# Patient Record
Sex: Male | Born: 1960 | Race: White | Hispanic: No | Marital: Married | Smoking: Never smoker
Health system: Southern US, Community
[De-identification: ages and names within clinical notes are randomized; demographics above are authoritative.]

## PROBLEM LIST (undated history)

## (undated) DIAGNOSIS — F419 Anxiety disorder, unspecified: Secondary | ICD-10-CM

## (undated) DIAGNOSIS — H8109 Meniere's disease, unspecified ear: Secondary | ICD-10-CM

## (undated) DIAGNOSIS — Z9889 Other specified postprocedural states: Secondary | ICD-10-CM

## (undated) DIAGNOSIS — R112 Nausea with vomiting, unspecified: Secondary | ICD-10-CM

## (undated) HISTORY — DX: Anxiety disorder, unspecified: F41.9

## (undated) HISTORY — DX: Meniere's disease, unspecified ear: H81.09

## (undated) HISTORY — DX: Other specified postprocedural states: Z98.890

## (undated) HISTORY — DX: Nausea with vomiting, unspecified: R11.2

## (undated) HISTORY — PX: ENDOLYMPHATIC SAC OPERATION: SUR433

---

## 2002-04-25 HISTORY — PX: SHOULDER SURGERY: SHX246

## 2004-03-29 ENCOUNTER — Emergency Department (HOSPITAL_COMMUNITY): Admission: EM | Admit: 2004-03-29 | Discharge: 2004-03-29 | Payer: Self-pay | Admitting: Emergency Medicine

## 2004-10-02 ENCOUNTER — Ambulatory Visit: Payer: Self-pay | Admitting: Family Medicine

## 2005-01-08 ENCOUNTER — Ambulatory Visit: Payer: Self-pay | Admitting: Family Medicine

## 2005-01-13 ENCOUNTER — Ambulatory Visit: Payer: Self-pay | Admitting: Family Medicine

## 2005-01-22 ENCOUNTER — Ambulatory Visit: Payer: Self-pay | Admitting: Family Medicine

## 2005-08-03 ENCOUNTER — Ambulatory Visit: Payer: Self-pay | Admitting: Family Medicine

## 2005-08-30 ENCOUNTER — Ambulatory Visit: Payer: Self-pay | Admitting: Family Medicine

## 2006-01-31 ENCOUNTER — Ambulatory Visit: Payer: Self-pay | Admitting: Family Medicine

## 2006-02-23 ENCOUNTER — Ambulatory Visit: Payer: Self-pay | Admitting: Family Medicine

## 2006-02-23 DIAGNOSIS — F339 Major depressive disorder, recurrent, unspecified: Secondary | ICD-10-CM

## 2006-05-26 DIAGNOSIS — M5126 Other intervertebral disc displacement, lumbar region: Secondary | ICD-10-CM

## 2006-10-18 ENCOUNTER — Ambulatory Visit: Payer: Self-pay | Admitting: Family Medicine

## 2006-10-18 ENCOUNTER — Encounter (INDEPENDENT_AMBULATORY_CARE_PROVIDER_SITE_OTHER): Payer: Self-pay | Admitting: Internal Medicine

## 2006-10-18 LAB — CONVERTED CEMR LAB
AST: 33 units/L (ref 0–37)
BUN: 11 mg/dL (ref 6–23)
CO2: 28 meq/L (ref 19–32)
Creatinine, Ser: 0.9 mg/dL (ref 0.4–1.5)
GFR calc Af Amer: 118 mL/min
Glucose, Bld: 108 mg/dL — ABNORMAL HIGH (ref 70–99)
HDL: 37 mg/dL — ABNORMAL LOW (ref >39.0)
Potassium: 4.1 meq/L (ref 3.5–5.1)
Sodium: 141 meq/L (ref 135–145)
Total CHOL/HDL Ratio: 3.5
Triglycerides: 80 mg/dL (ref 0–149)

## 2006-10-28 DIAGNOSIS — R0789 Other chest pain: Secondary | ICD-10-CM

## 2006-11-01 ENCOUNTER — Encounter: Payer: Self-pay | Admitting: *Deleted

## 2006-11-01 ENCOUNTER — Ambulatory Visit: Payer: Self-pay | Admitting: Internal Medicine

## 2006-11-01 DIAGNOSIS — F411 Generalized anxiety disorder: Secondary | ICD-10-CM | POA: Insufficient documentation

## 2007-08-17 ENCOUNTER — Ambulatory Visit: Payer: Self-pay | Admitting: Family Medicine

## 2007-08-17 DIAGNOSIS — E782 Mixed hyperlipidemia: Secondary | ICD-10-CM

## 2007-08-17 DIAGNOSIS — L821 Other seborrheic keratosis: Secondary | ICD-10-CM

## 2007-08-17 DIAGNOSIS — Q828 Other specified congenital malformations of skin: Secondary | ICD-10-CM | POA: Insufficient documentation

## 2007-08-18 LAB — CONVERTED CEMR LAB
ALT: 61 units/L — ABNORMAL HIGH (ref 0–53)
AST: 29 units/L (ref 0–37)
BUN: 12 mg/dL (ref 6–23)
GFR calc Af Amer: 104 mL/min
GFR calc non Af Amer: 86 mL/min
HDL: 33.1 mg/dL — ABNORMAL LOW (ref >39.0)
LDL Cholesterol: 83 mg/dL (ref 0–99)
Potassium: 4 meq/L (ref 3.5–5.1)
Sodium: 137 meq/L (ref 135–145)
Total CHOL/HDL Ratio: 4
Triglycerides: 90 mg/dL (ref 0–149)
VLDL: 18 mg/dL (ref 0–40)

## 2007-08-23 ENCOUNTER — Encounter (INDEPENDENT_AMBULATORY_CARE_PROVIDER_SITE_OTHER): Payer: Self-pay | Admitting: Internal Medicine

## 2007-08-23 DIAGNOSIS — N410 Acute prostatitis: Secondary | ICD-10-CM | POA: Insufficient documentation

## 2007-08-23 DIAGNOSIS — R740 Nonspecific elevation of levels of transaminase and lactic acid dehydrogenase [LDH]: Secondary | ICD-10-CM

## 2007-08-23 DIAGNOSIS — M549 Dorsalgia, unspecified: Secondary | ICD-10-CM | POA: Insufficient documentation

## 2007-11-24 ENCOUNTER — Telehealth: Payer: Self-pay | Admitting: Family Medicine

## 2007-12-01 ENCOUNTER — Telehealth (INDEPENDENT_AMBULATORY_CARE_PROVIDER_SITE_OTHER): Payer: Self-pay | Admitting: Internal Medicine

## 2008-01-13 ENCOUNTER — Emergency Department (HOSPITAL_COMMUNITY): Admission: EM | Admit: 2008-01-13 | Discharge: 2008-01-13 | Payer: Self-pay | Admitting: Family Medicine

## 2008-02-26 ENCOUNTER — Ambulatory Visit: Payer: Self-pay | Admitting: Family Medicine

## 2008-02-26 DIAGNOSIS — R42 Dizziness and giddiness: Secondary | ICD-10-CM

## 2008-02-26 DIAGNOSIS — L851 Acquired keratosis [keratoderma] palmaris et plantaris: Secondary | ICD-10-CM

## 2008-03-28 ENCOUNTER — Telehealth (INDEPENDENT_AMBULATORY_CARE_PROVIDER_SITE_OTHER): Payer: Self-pay | Admitting: Internal Medicine

## 2008-04-05 ENCOUNTER — Telehealth (INDEPENDENT_AMBULATORY_CARE_PROVIDER_SITE_OTHER): Payer: Self-pay | Admitting: Internal Medicine

## 2008-04-08 ENCOUNTER — Encounter (INDEPENDENT_AMBULATORY_CARE_PROVIDER_SITE_OTHER): Payer: Self-pay | Admitting: Internal Medicine

## 2008-07-03 ENCOUNTER — Ambulatory Visit: Payer: Self-pay | Admitting: Family Medicine

## 2008-07-03 DIAGNOSIS — B9789 Other viral agents as the cause of diseases classified elsewhere: Secondary | ICD-10-CM

## 2008-07-04 ENCOUNTER — Ambulatory Visit: Payer: Self-pay | Admitting: Family Medicine

## 2008-11-25 LAB — CONVERTED CEMR LAB
ALT: 60 units/L — ABNORMAL HIGH (ref 0–53)
AST: 34 units/L (ref 0–37)
Basophils Absolute: 0 10*3/uL (ref 0.0–0.1)
Basophils Relative: 0.3 % (ref 0.0–3.0)
Bilirubin, Direct: 0.1 mg/dL (ref 0.0–0.3)
CO2: 27 meq/L (ref 19–32)
Chloride: 105 meq/L (ref 96–112)
Cholesterol: 132 mg/dL (ref 0–200)
Glucose, Bld: 101 mg/dL — ABNORMAL HIGH (ref 70–99)
Hemoglobin: 15.9 g/dL (ref 13.0–17.0)
LDL Cholesterol: 81 mg/dL (ref 0–99)
Lymphocytes Relative: 23.1 % (ref 12.0–46.0)
MCHC: 34.4 g/dL (ref 30.0–36.0)
Monocytes Relative: 18.5 % — ABNORMAL HIGH (ref 3.0–12.0)
Neutro Abs: 4.1 10*3/uL (ref 1.4–7.7)
Neutrophils Relative %: 54.9 % (ref 43.0–77.0)
RBC: 5 M/uL (ref 4.22–5.81)
RDW: 11.3 % — ABNORMAL LOW (ref 11.5–14.6)
Sodium: 138 meq/L (ref 135–145)
Total Bilirubin: 1 mg/dL (ref 0.3–1.2)
Total CHOL/HDL Ratio: 3.6
Total Protein: 7.1 g/dL (ref 6.0–8.3)
VLDL: 15 mg/dL (ref 0–40)

## 2009-03-20 ENCOUNTER — Ambulatory Visit: Payer: Self-pay | Admitting: Family Medicine

## 2009-03-20 DIAGNOSIS — R221 Localized swelling, mass and lump, neck: Secondary | ICD-10-CM

## 2009-03-20 DIAGNOSIS — M542 Cervicalgia: Secondary | ICD-10-CM | POA: Insufficient documentation

## 2009-03-20 DIAGNOSIS — R220 Localized swelling, mass and lump, head: Secondary | ICD-10-CM | POA: Insufficient documentation

## 2009-03-20 DIAGNOSIS — R5383 Other fatigue: Secondary | ICD-10-CM

## 2009-03-20 DIAGNOSIS — R5381 Other malaise: Secondary | ICD-10-CM

## 2009-03-21 ENCOUNTER — Encounter: Admission: RE | Admit: 2009-03-21 | Discharge: 2009-03-21 | Payer: Self-pay | Admitting: Family Medicine

## 2009-03-21 IMAGING — CR DG CERVICAL SPINE COMPLETE 4+V
6 series · 6 of 6 positions shown · non-contrast
Comparison: None

CLINICAL DATA: Left-sided neck pain

CERVICAL SPINE - COMPLETE 4+ VIEW

[w c-spine lat]
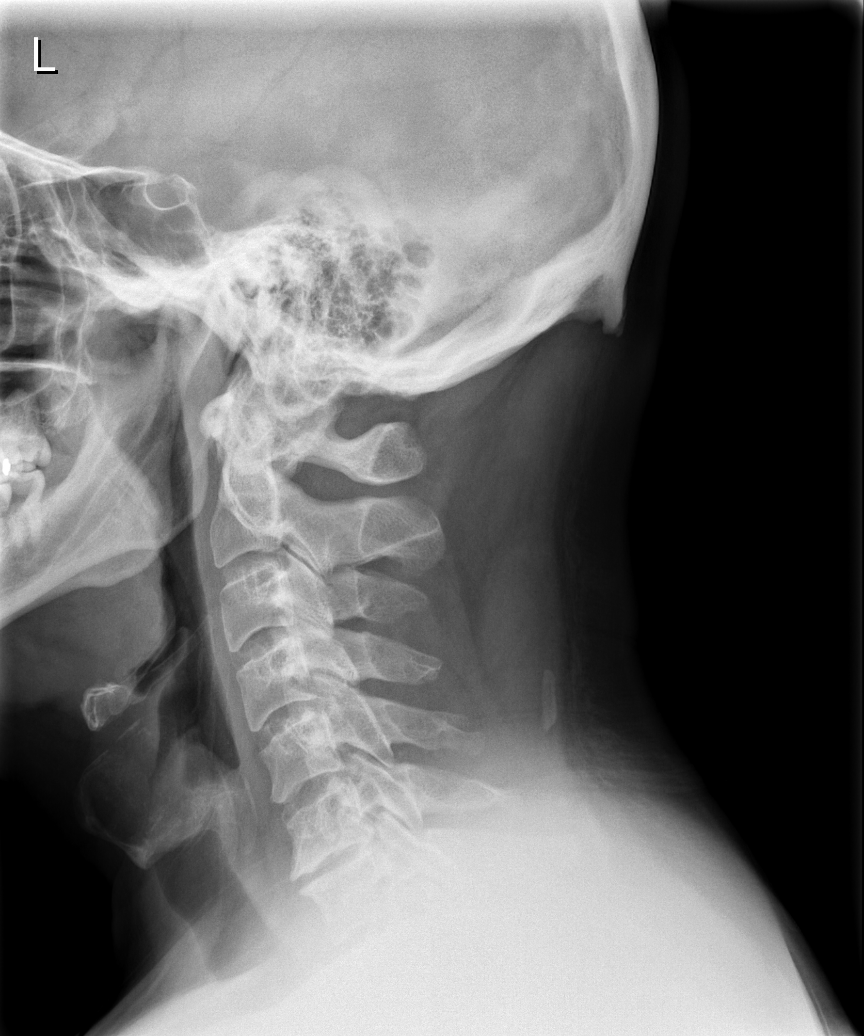

[w c-spine oblique (1 of 2)]
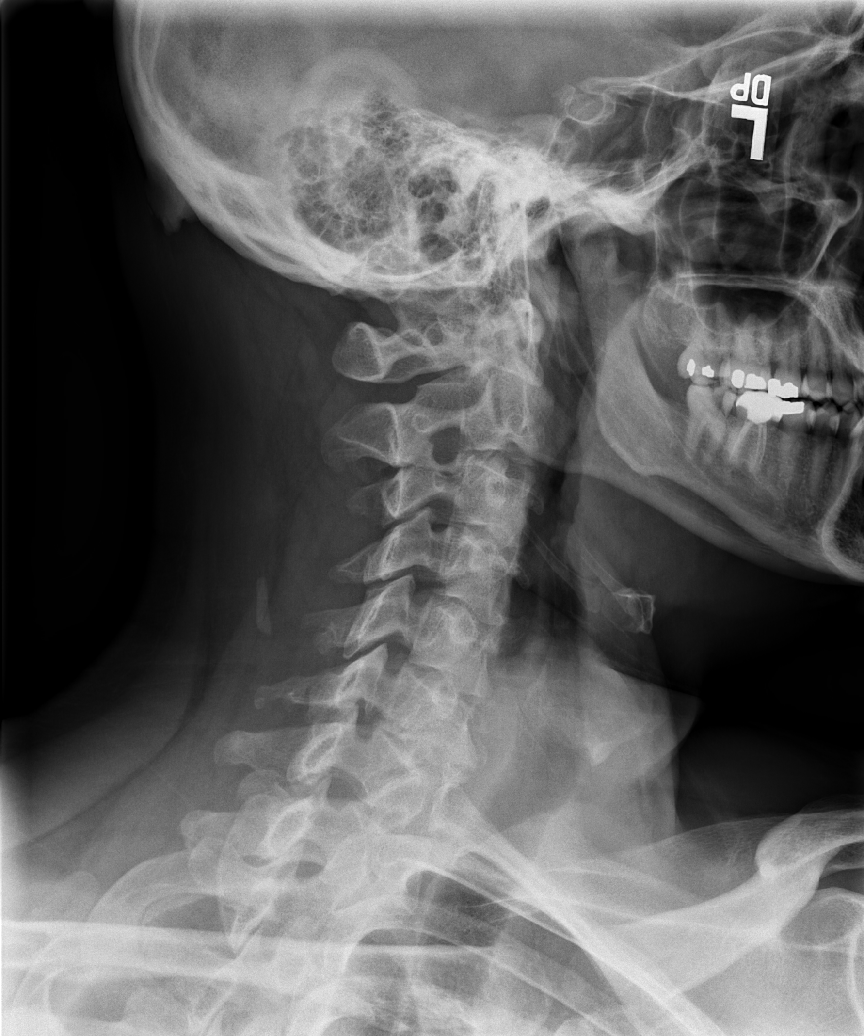

[w c-spine oblique (2 of 2)]
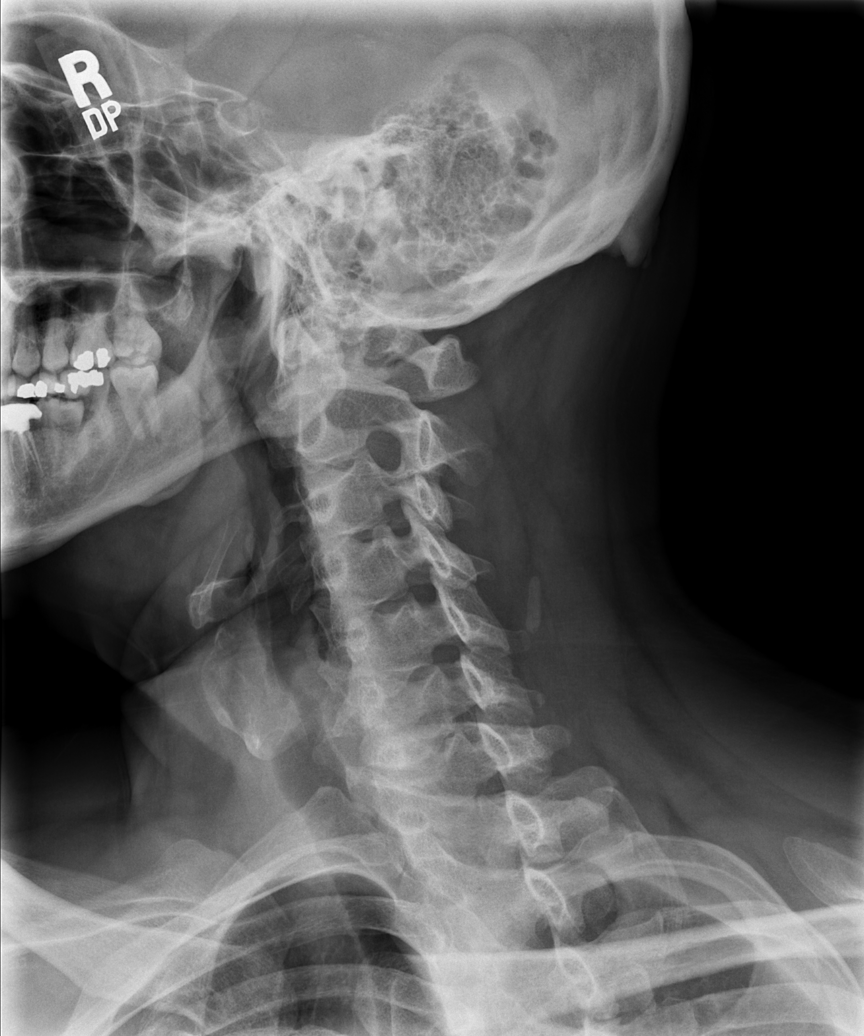

[w c-spine a.p. *]
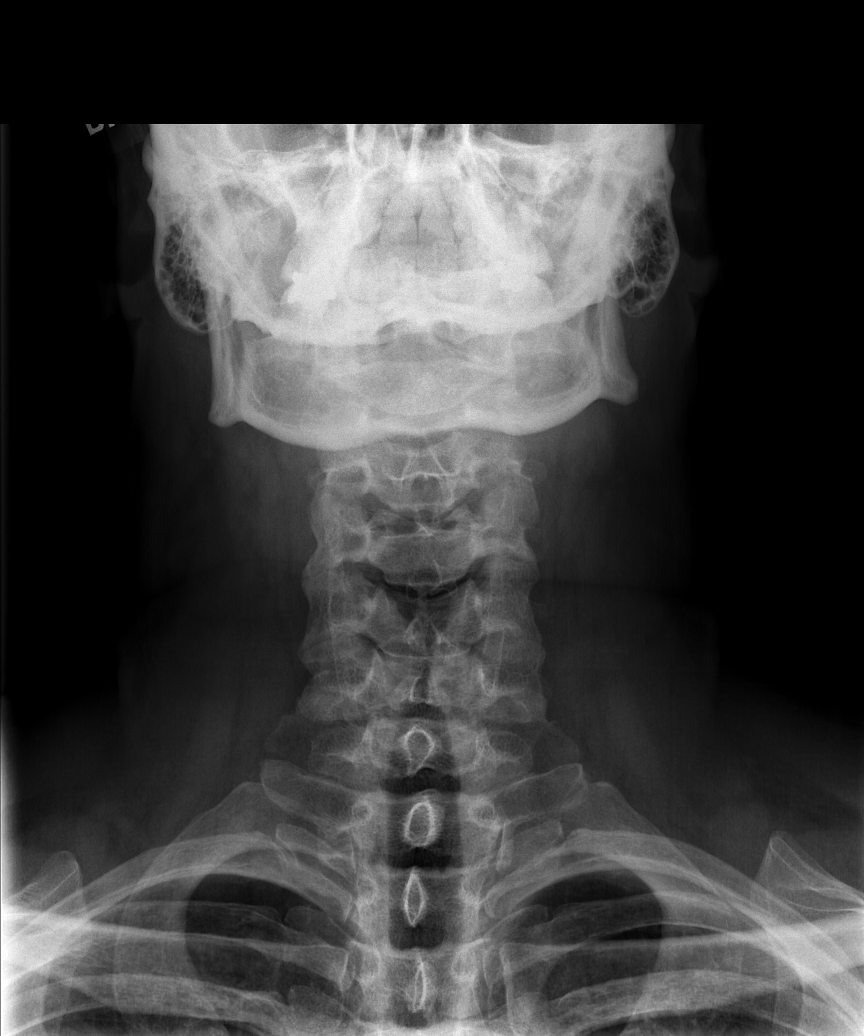

[w c-spine odontoid *]
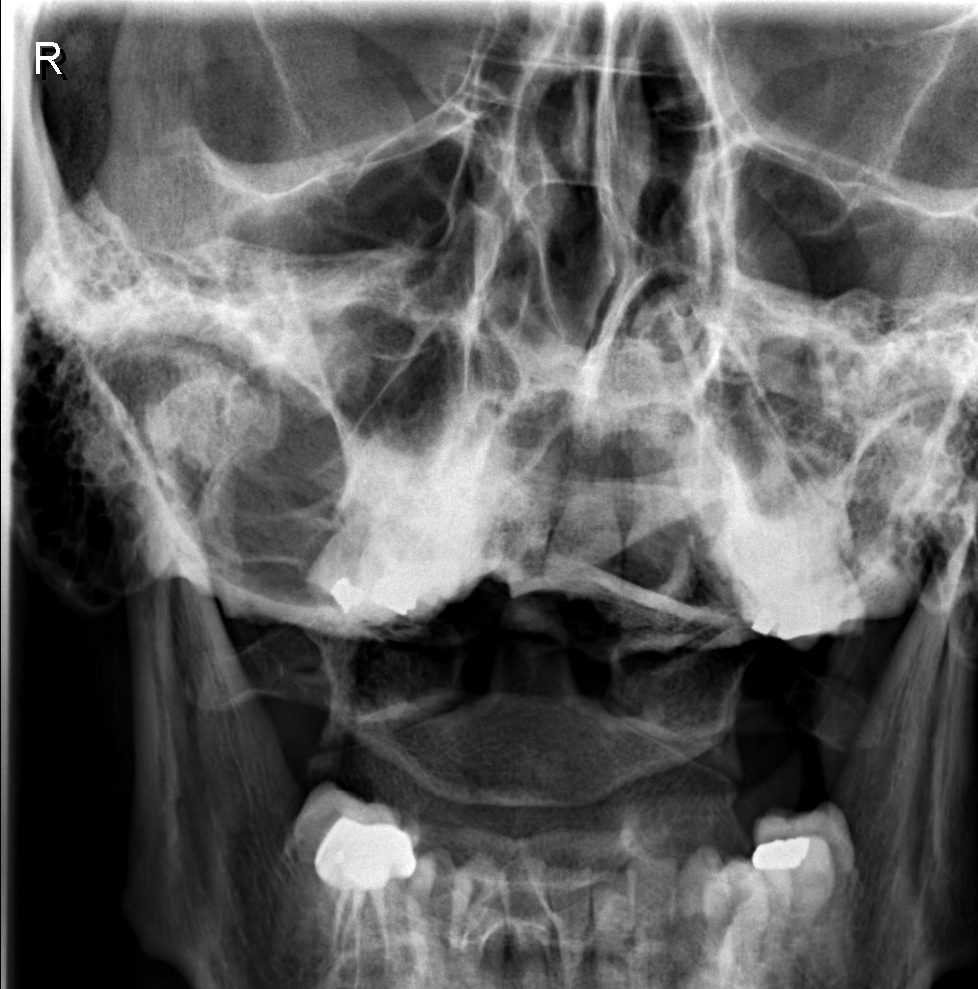

[w swimmers view *]
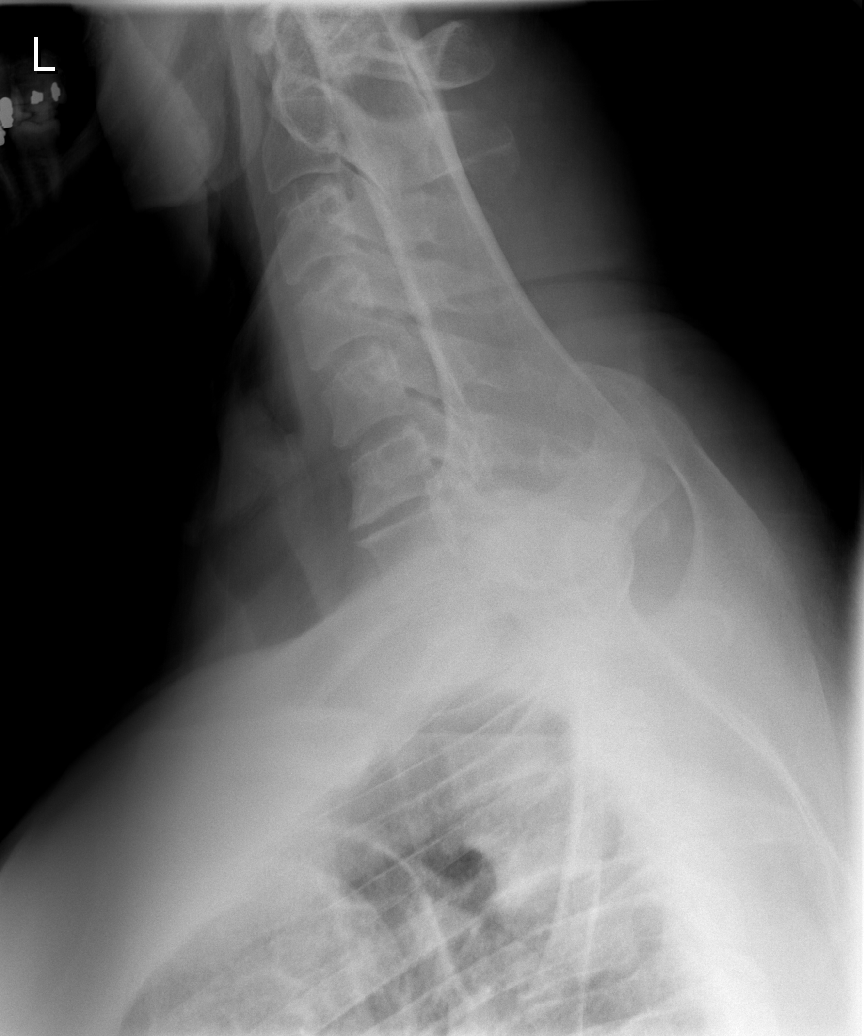

[6 of 6 positions shown; findings below may reference images not displayed]

FINDINGS: Alignment is normal.  There is disc space narrowing at C6-
7 with osteophyte formation.  There is mild foraminal encroachment
by osteophytes at that level.  No evidence of facet arthropathy.
No focal osseous lesion.
IMPRESSION: Degenerative spondylosis C6-7 with mild osteophytic encroachment
upon the foramina.

## 2009-03-27 ENCOUNTER — Encounter (INDEPENDENT_AMBULATORY_CARE_PROVIDER_SITE_OTHER): Payer: Self-pay | Admitting: Internal Medicine

## 2009-05-20 ENCOUNTER — Telehealth (INDEPENDENT_AMBULATORY_CARE_PROVIDER_SITE_OTHER): Payer: Self-pay | Admitting: Internal Medicine

## 2009-05-22 ENCOUNTER — Encounter: Payer: Self-pay | Admitting: Family Medicine

## 2009-06-19 ENCOUNTER — Inpatient Hospital Stay (HOSPITAL_COMMUNITY): Admission: EM | Admit: 2009-06-19 | Discharge: 2009-06-21 | Payer: Self-pay | Admitting: Emergency Medicine
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
## 2009-06-23 ENCOUNTER — Telehealth: Payer: Self-pay | Admitting: Family Medicine

## 2009-06-24 ENCOUNTER — Ambulatory Visit: Payer: Self-pay | Admitting: Family Medicine

## 2009-10-08 ENCOUNTER — Encounter (INDEPENDENT_AMBULATORY_CARE_PROVIDER_SITE_OTHER): Payer: Self-pay | Admitting: *Deleted

## 2010-05-08 ENCOUNTER — Telehealth: Payer: Self-pay | Admitting: Family Medicine

## 2010-05-26 ENCOUNTER — Ambulatory Visit: Payer: Self-pay | Admitting: Family Medicine

## 2010-05-27 ENCOUNTER — Encounter: Payer: Self-pay | Admitting: Family Medicine

## 2010-05-27 LAB — CONVERTED CEMR LAB
Albumin: 3.9 g/dL (ref 3.5–5.2)
HDL: 37 mg/dL — ABNORMAL LOW (ref >39.00)
LDL Cholesterol: 97 mg/dL (ref 0–99)
PSA: 0.37 ng/mL (ref 0.10–4.00)
Total Bilirubin: 0.9 mg/dL (ref 0.3–1.2)
Total CHOL/HDL Ratio: 4
Triglycerides: 119 mg/dL (ref 0.0–149.0)
VLDL: 23.8 mg/dL (ref 0.0–40.0)

## 2010-08-25 NOTE — Assessment & Plan Note (Signed)
Summary: med refill/dlo   Vital Signs:  Patient profile:   50 year old male Height:      72 inches Weight:      248 pounds BMI:     33.76 Temp:     98.3 degrees F oral Pulse rate:   72 / minute Pulse rhythm:   regular BP sitting:   120 / 80  (right arm) Cuff size:   large  Vitals Entered By: Linde Gillis CMA Duncan Dull) (May 26, 2010 8:14 AM) CC: medication refill   History of Present Illness: 50 yo here for medication refill and fasting blood work.  HLD- cholesterol last checked in 06/2008- total 132, HDL 36, LDL 81.  On Vytorin 10-20 mg daily, Fish oil 1000 mg daily.  No myalgias or other noticible side effects.  Tries to stay active[place]  Family h/o prostate CA- grandfather and father had prostate Ca in 50-60s.  Wants his PSA checked.  Denies any symptoms such as urinary frequency, difficulty starting/stopping urinary stream, or dysuria.     Current Medications (verified): 1)  Vytorin 10-20 Mg Tabs (Ezetimibe-Simvastatin) .... Take 1 Tablet By Mouth Once A Day 2)  Adult Aspirin Ec Low Strength 81 Mg  Tbec (Aspirin) .... Take 1 Tablet By Mouth Once A Day 3)  Fish Oil 1000 Mg  Caps (Omega-3 Fatty Acids) .... Take 1 Capsule By Mouth Once A Day 4)  Fexofenadine Hcl 180 Mg Tabs (Fexofenadine Hcl) .... One Daily As Needed  Allergies: 1)  ! Floxin 2)  Cipro  Past History:  Past Surgical History: Last updated: 08/23/2007 Left shoulder repair (04/2002) MRI- protrusion LS-S1 disk centrally (05/2006)  Family History: Last updated: 03/20/2009 Family History of Cardiovascular disorder- Father and brother. Father:   prostate cancer Mother:  Siblings: 1 brother,                                                                                               1 sister   DM- MI- CVA-  Prostate Cancer- Breast Cancer- Ovarian Cancer- Uterine Cancer- Colon Cancer- Drug/ ETOH Abuse- Depression-   Social History: Last updated: 08/23/2007 Marital Status:  Married Children: 2 Occupation: landscaper  Risk Factors: Smoking Status: never (11/01/2006) Passive Smoke Exposure: no (02/26/2008)  Review of Systems      See HPI General:  Denies malaise. GU:  Denies hematuria, urinary frequency, and urinary hesitancy. MS:  Denies joint pain, joint redness, joint swelling, and muscle weakness.  Physical Exam  General:  alert, well-developed, well-nourished, well-hydrated, and overweight-appearing.  Head:  normocephalic, atraumatic, and no abnormalities observed.   Eyes:  vision grossly intact, pupils equal, and pupils round.   Ears:  R ear normal and L ear normal.   Nose:  no external deformity.   Mouth:  good dentition.   Lungs:  normal respiratory effort, no intercostal retractions, no accessory muscle use, and normal breath sounds.   Heart:  normal rate, regular rhythm, and no murmur.   Abdomen:  Bowel sounds positive,abdomen soft and non-tender without masses, organomegaly or hernias noted. Extremities:  no edema Neurologic:  alert &  oriented X3 and gait normal.   Psych:  Cognition and judgment appear intact. Alert and cooperative with normal attention span and concentration. No apparent delusions, illusions, hallucinations   Impression & Recommendations:  Problem # 1:  FAMILY HISTORY OF MALIGNANT NEOPLASM PROSTATE (ICD-V16.42) Assessment Unchanged recheck PSA today.  Problem # 2:  HYPERLIPIDEMIA (ICD-272.2) Assessment: Unchanged refilled Vytorin, recheck Lipid panel, Hepatic panel.   His updated medication list for this problem includes:    Vytorin 10-20 Mg Tabs (Ezetimibe-simvastatin) .Marland Kitchen... Take 1 tablet by mouth once a day  Orders: Venipuncture (27253) TLB-Lipid Panel (80061-LIPID) TLB-Hepatic/Liver Function Pnl (80076-HEPATIC)  Complete Medication List: 1)  Vytorin 10-20 Mg Tabs (Ezetimibe-simvastatin) .... Take 1 tablet by mouth once a day 2)  Adult Aspirin Ec Low Strength 81 Mg Tbec (Aspirin) .... Take 1 tablet by mouth  once a day 3)  Fish Oil 1000 Mg Caps (Omega-3 fatty acids) .... Take 1 capsule by mouth once a day 4)  Fexofenadine Hcl 180 Mg Tabs (Fexofenadine hcl) .... One daily as needed  Other Orders: TLB-PSA (Prostate Specific Antigen) (84153-PSA) Prescriptions: VYTORIN 10-20 MG TABS (EZETIMIBE-SIMVASTATIN) Take 1 tablet by mouth once a day  #30 x 11   Entered and Authorized by:   Ruthe Mannan MD   Signed by:   Ruthe Mannan MD on 05/26/2010   Method used:   Electronically to        [facility]* (retail)       [address]       [place], [place]  [address]       Ph: [phone]       Fax: [phone]   RxID:   [id number]    Orders Added: 1)  Venipuncture [93235] 2)  TLB-Lipid Panel [80061-LIPID] 3)  TLB-Hepatic/Liver Function Pnl [80076-HEPATIC] 4)  TLB-PSA (Prostate Specific Antigen) [84153-PSA] 5)  Est. Patient Level IV [57322]    Current Allergies (reviewed today): ! FLOXIN CIPRO

## 2010-08-25 NOTE — Progress Notes (Signed)
Summary: vytorin   Phone Note Refill Request Call back at Home Phone [phone] Message from:  Patient on May 08, 2010 1:41 PM  Refills Requested: Medication #1:  VYTORIN 10-20 MG TABS Take 1 tablet by mouth once a day Patient has an appt. on the 28th to see Dr. Dayton Martes, but needs med until then. Wants rx sent to [facility].   Initial call taken by: Melody Comas,  May 08, 2010 1:41 PM    Prescriptions: VYTORIN 10-20 MG TABS (EZETIMIBE-SIMVASTATIN) Take 1 tablet by mouth once a day  #30 x 0   Entered by:   Lowella Petties CMA   Authorized by:   Ruthe Mannan MD   Signed by:   Lowella Petties CMA on 05/08/2010   Method used:   Electronically to        [facility]* (retail)       [address]       [place], [place]  [address]       Ph: [phone]       Fax: [phone]   RxID:   [id number]

## 2010-08-25 NOTE — Miscellaneous (Signed)
Summary: Immunization Entry   Immunization History:  Influenza Immunization History:    Influenza:  historical (04/23/2010)

## 2010-08-25 NOTE — Letter (Signed)
Summary: Generic Letter  [facility]  [address] [place], [place] [address]   Phone: [phone]  Fax: [phone]    05/27/2010  [facility] [address] [place], [place]  [address]  Dear Mr. Cheslock,    We have received your lab results and Dr. Dayton Martes says your cholesterol panel is stable, HDL (good cholesterol) still a little low but overall looks great.  PSA normal as well.  Recheck next year.  Enclosed is a copy of your lab results.       Sincerely,       Linde Gillis CMA (AAMA)for Dr. Ruthe Mannan

## 2010-08-25 NOTE — Letter (Signed)
Summary: [facility]   Imported By: Maryln Gottron 10/14/2009 15:57:01  _____________________________________________________________________  External Attachment:    Type:   Image     Comment:   External Document

## 2010-10-28 LAB — URINALYSIS, ROUTINE W REFLEX MICROSCOPIC
Bilirubin Urine: NEGATIVE
Ketones, ur: NEGATIVE mg/dL
Nitrite: NEGATIVE
Protein, ur: 30 mg/dL — AB
Specific Gravity, Urine: 1.014 (ref 1.005–1.030)
Urobilinogen, UA: 0.2 mg/dL (ref 0.0–1.0)

## 2010-10-28 LAB — CBC
HCT: 40.8 % (ref 39.0–52.0)
Hemoglobin: 14 g/dL (ref 13.0–17.0)
Hemoglobin: 16.8 g/dL (ref 13.0–17.0)
Platelets: 195 10*3/uL (ref 150–400)
Platelets: 222 10*3/uL (ref 150–400)
RBC: 5.23 MIL/uL (ref 4.22–5.81)
RDW: 11.8 % (ref 11.5–15.5)
RDW: 12.3 % (ref 11.5–15.5)
RDW: 12.4 % (ref 11.5–15.5)
WBC: 10.8 10*3/uL — ABNORMAL HIGH (ref 4.0–10.5)
WBC: 14.1 10*3/uL — ABNORMAL HIGH (ref 4.0–10.5)
WBC: 17 10*3/uL — ABNORMAL HIGH (ref 4.0–10.5)

## 2010-10-28 LAB — DIFFERENTIAL
Basophils Absolute: 0 10*3/uL (ref 0.0–0.1)
Eosinophils Relative: 1 % (ref 0–5)
Lymphocytes Relative: 21 % (ref 12–46)
Lymphs Abs: 3.6 10*3/uL (ref 0.7–4.0)
Neutro Abs: 12 10*3/uL — ABNORMAL HIGH (ref 1.7–7.7)

## 2010-10-28 LAB — URINE CULTURE
Colony Count: NO GROWTH
Culture: NO GROWTH

## 2010-10-28 LAB — BASIC METABOLIC PANEL
BUN: 9 mg/dL (ref 6–23)
Calcium: 8.7 mg/dL (ref 8.4–10.5)
Calcium: 8.8 mg/dL (ref 8.4–10.5)
Creatinine, Ser: 1.07 mg/dL (ref 0.4–1.5)
GFR calc non Af Amer: >60 mL/min (ref >60)
GFR calc non Af Amer: >60 mL/min (ref >60)
Glucose, Bld: 197 mg/dL — ABNORMAL HIGH (ref 70–99)
Potassium: 3.1 mEq/L — ABNORMAL LOW (ref 3.5–5.1)
Sodium: 138 mEq/L (ref 135–145)

## 2010-10-28 LAB — RAPID URINE DRUG SCREEN, HOSP PERFORMED
Amphetamines: NOT DETECTED
Benzodiazepines: NOT DETECTED
Cocaine: NOT DETECTED
Opiates: POSITIVE — AB
Tetrahydrocannabinol: NOT DETECTED

## 2010-10-28 LAB — COMPREHENSIVE METABOLIC PANEL
AST: 32 U/L (ref 0–37)
Albumin: 4.2 g/dL (ref 3.5–5.2)
Calcium: 9.2 mg/dL (ref 8.4–10.5)
Chloride: 105 mEq/L (ref 96–112)
GFR calc non Af Amer: >60 mL/min (ref >60)
Glucose, Bld: 113 mg/dL — ABNORMAL HIGH (ref 70–99)
Potassium: 3.3 mEq/L — ABNORMAL LOW (ref 3.5–5.1)
Sodium: 141 mEq/L (ref 135–145)
Total Bilirubin: 0.8 mg/dL (ref 0.3–1.2)

## 2010-10-28 LAB — ETHANOL: Alcohol, Ethyl (B): <5 mg/dL (ref 0–10)

## 2010-10-28 LAB — PROTIME-INR
INR: 1.06 (ref 0.00–1.49)
Prothrombin Time: 13.7 seconds (ref 11.6–15.2)

## 2010-10-28 LAB — TYPE AND SCREEN: ABO/RH(D): O NEG

## 2010-10-28 LAB — POCT I-STAT, CHEM 8
Calcium, Ion: 1.14 mmol/L (ref 1.12–1.32)
Chloride: 102 mEq/L (ref 96–112)
HCT: 51 % (ref 39.0–52.0)
Potassium: 3.7 mEq/L (ref 3.5–5.1)
Sodium: 140 mEq/L (ref 135–145)

## 2010-10-28 LAB — LACTIC ACID, PLASMA
Lactic Acid, Venous: 4.4 mmol/L — ABNORMAL HIGH (ref 0.5–2.2)
Lactic Acid, Venous: 4.5 mmol/L — ABNORMAL HIGH (ref 0.5–2.2)

## 2010-10-28 LAB — APTT: aPTT: 26 seconds (ref 24–37)

## 2010-10-28 LAB — ABO/RH: ABO/RH(D): O NEG

## 2010-10-28 LAB — URINE MICROSCOPIC-ADD ON

## 2011-02-19 ENCOUNTER — Ambulatory Visit: Payer: Self-pay | Admitting: Family Medicine

## 2011-04-16 ENCOUNTER — Encounter: Payer: Self-pay | Admitting: Family Medicine

## 2011-04-19 ENCOUNTER — Encounter: Payer: Self-pay | Admitting: Family Medicine

## 2011-04-19 ENCOUNTER — Ambulatory Visit (INDEPENDENT_AMBULATORY_CARE_PROVIDER_SITE_OTHER): Payer: Self-pay | Admitting: Family Medicine

## 2011-04-19 VITALS — BP 120/82 | HR 86 | Temp 98.3°F | Wt 235.2 lb

## 2011-04-19 DIAGNOSIS — R739 Hyperglycemia, unspecified: Secondary | ICD-10-CM

## 2011-04-19 DIAGNOSIS — Z0000 Encounter for general adult medical examination without abnormal findings: Secondary | ICD-10-CM

## 2011-04-19 DIAGNOSIS — H8109 Meniere's disease, unspecified ear: Secondary | ICD-10-CM

## 2011-04-19 DIAGNOSIS — Z125 Encounter for screening for malignant neoplasm of prostate: Secondary | ICD-10-CM

## 2011-04-19 DIAGNOSIS — E782 Mixed hyperlipidemia: Secondary | ICD-10-CM

## 2011-04-19 DIAGNOSIS — K120 Recurrent oral aphthae: Secondary | ICD-10-CM

## 2011-04-19 DIAGNOSIS — R7309 Other abnormal glucose: Secondary | ICD-10-CM

## 2011-04-19 LAB — BASIC METABOLIC PANEL
BUN: 12 mg/dL (ref 6–23)
Chloride: 96 mEq/L (ref 96–112)
GFR: 83.55 mL/min (ref >60.00)
Glucose, Bld: 119 mg/dL — ABNORMAL HIGH (ref 70–99)
Potassium: 3.7 mEq/L (ref 3.5–5.1)
Sodium: 138 mEq/L (ref 135–145)

## 2011-04-19 LAB — LIPID PANEL
Cholesterol: 163 mg/dL (ref 0–200)
HDL: 40.8 mg/dL (ref >39.00)
LDL Cholesterol: 101 mg/dL — ABNORMAL HIGH (ref 0–99)
VLDL: 21 mg/dL (ref 0.0–40.0)

## 2011-04-19 LAB — HEMOGLOBIN A1C: Hgb A1c MFr Bld: 5.7 % (ref 4.6–6.5)

## 2011-04-19 LAB — HEPATIC FUNCTION PANEL
AST: 30 U/L (ref 0–37)
Albumin: 4.2 g/dL (ref 3.5–5.2)
Alkaline Phosphatase: 101 U/L (ref 39–117)
Total Protein: 7.4 g/dL (ref 6.0–8.3)

## 2011-04-19 MED ORDER — FIRST-DUKES MOUTHWASH MT SUSP: OROMUCOSAL | Status: DC

## 2011-04-19 NOTE — Progress Notes (Signed)
  Subjective:    Patient ID: Eric Pierce, male    DOB: September 14, 1960, 50 y.o.   MRN: [id number]  HPI  50 yo here for mouth ulcers.  Had surgery for meniere's disease 6 weeks ago.  Had been doing well- ear ringing vertigo nearly resolved until 1 week ago when he developed severe URI symptoms- sinus pressure, cough, chils. Saw ENT who placed him on Clindaymycin.  After three doses, noticed multiple painful lesions in his mouth she he stopped taking it.  Mouth lesions still there.  URI symptoms improve.  No difficulty swallowing, no itching or rash.  Patient Active Problem List  Diagnoses  . VIRAL INFECTION  . HYPERLIPIDEMIA  . ANXIETY STATE NOS  . DISORDER, DEPRESSIVE NEC  . PROSTATITIS, ACUTE  . KERATOSIS  . SEBORRHEIC KERATOSIS  . 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 NECK PAIN, LEFT  . BACK PAIN  . DRY SKIN  . VERTIGO  . FATIGUE  . NECK MASS  . CHEST PAIN, NON-CARDIAC  . TRANSAMINASES, SERUM, ELEVATED  . Aphthous ulcer of mouth  . Meniere disease   No past medical history on file. Past Surgical History  Procedure Date  . Shoulder surgery 04/2002    left   History  Substance Use Topics  . Smoking status: Never Smoker   . Smokeless tobacco: Not on file  . Alcohol Use: Not on file   Family History  Problem Relation Age of Onset  . Heart disease Father   . Cancer Father     prostate  . Heart disease Brother    Allergies  Allergen Reactions  . Ciprofloxacin     REACTION: sleepy  . Ofloxacin    Current Outpatient Prescriptions on File Prior to Visit  Medication Sig Dispense Refill  . aspirin (ASPIRIN LOW DOSE) 81 MG tablet Take 81 mg by mouth daily.        Marland Kitchen ezetimibe-simvastatin (VYTORIN) 10-20 MG per tablet Take 1 tablet by mouth daily.        . fexofenadine (ALLEGRA) 180 MG tablet Take 180 mg by mouth daily.        . Omega-3 Fatty Acids (FISH OIL) 1000 MG CAPS Take 1 capsule by mouth daily.         The PMH, PSH, Social History, Family History,  Medications, and allergies have been reviewed in [facility], and have been updated if relevant.   Review of Systems See HPI    Objective:   Physical Exam BP 120/82  Pulse 86  Temp(Src) 98.3 F (36.8 C) (Oral)  Wt 235 lb 4 oz (106.709 kg) Gen:  Alert, pleasant, NAD HEENT:  Multiple aphthous ulcers on roof of mouth and buccal mucosa, pos PND TMS clear bilaterally CVS:  RRR Resp:  CTA bilaterally Ext:  No edema       Assessment & Plan:   1. Aphthous ulcer of mouth   New.  Most likely secondary to viral process and not allergy to Clindamycin but that will be difficult to know for sure.  Advised staying away from it unless absolutely necessary.   The patient indicates understanding of these issues and agrees with the plan. Rx given for magic mouthwash.  2. Meniere disease   S/p sugery, awaiting records from ENT.

## 2011-04-19 NOTE — Patient Instructions (Signed)
Good to see you. Call me if you're not feeling better over next several days.

## 2011-05-21 ENCOUNTER — Ambulatory Visit: Payer: BC Managed Care – PPO | Admitting: Family Medicine

## 2011-05-27 ENCOUNTER — Other Ambulatory Visit: Payer: Self-pay | Admitting: *Deleted

## 2011-05-27 MED ORDER — EZETIMIBE-SIMVASTATIN 10-20 MG PO TABS: 1.0000 | ORAL_TABLET | Freq: Every day | ORAL | Status: DC

## 2011-05-28 ENCOUNTER — Other Ambulatory Visit: Payer: Self-pay | Admitting: *Deleted

## 2011-05-28 NOTE — Telephone Encounter (Signed)
Opened in error

## 2011-07-27 HISTORY — PX: MASTOIDECTOMY: SHX711

## 2011-09-13 ENCOUNTER — Ambulatory Visit (INDEPENDENT_AMBULATORY_CARE_PROVIDER_SITE_OTHER): Payer: BC Managed Care – PPO | Admitting: Family Medicine

## 2011-09-13 ENCOUNTER — Encounter: Payer: Self-pay | Admitting: Family Medicine

## 2011-09-13 DIAGNOSIS — J019 Acute sinusitis, unspecified: Secondary | ICD-10-CM | POA: Insufficient documentation

## 2011-09-13 MED ORDER — AMOXICILLIN-POT CLAVULANATE 875-125 MG PO TABS: 1.0000 | ORAL_TABLET | Freq: Two times a day (BID) | ORAL | Status: DC

## 2011-09-13 MED ORDER — GUAIFENESIN-CODEINE 100-10 MG/5ML PO SYRP: 5.0000 mL | ORAL_SOLUTION | Freq: Two times a day (BID) | ORAL | Status: AC | PRN

## 2011-09-13 NOTE — Assessment & Plan Note (Signed)
Acute sinusitis.  Only 6-7 days into it but worsening instead of improving, WASP script for augmentin provided as well as cheratussin. Fill if any worsening or not improving as expected. See pt instructions for further care recs.

## 2011-09-13 NOTE — Progress Notes (Signed)
  Subjective:    Patient ID: Eric Pierce, male    DOB: 06/23/1961, 51 y.o.   MRN: [id number]  HPI CC: cough, pressure  7d h/o cough productive of yellow mucous.  Also with sinus pressure and drainage.  Worsening head pressure underneath eyes.  Low grade fevers.  + tooth pain last few days.  Cough keeping him up at night.  Has tried dayquil.  H/o meniere's, s/p L ear surgery 02/2011  No ear pain, abd pain, n/v, diarrhea, rashes   + daughter sick last week.  No smokers at home.  No h/o asthma, COPD.  Review of Systems Per HPI    Objective:   Physical Exam  Nursing note reviewed. Constitutional: He appears well-developed and well-nourished. No distress.  HENT:  Head: Normocephalic and atraumatic.  Right Ear: Hearing, tympanic membrane, external ear and ear canal normal.  Left Ear: Hearing, tympanic membrane, external ear and ear canal normal.  Nose: Nose normal. No mucosal edema or rhinorrhea. Right sinus exhibits no maxillary sinus tenderness and no frontal sinus tenderness. Left sinus exhibits no maxillary sinus tenderness and no frontal sinus tenderness.  Mouth/Throat: Uvula is midline, oropharynx is clear and moist and mucous membranes are normal. No oropharyngeal exudate, posterior oropharyngeal edema, posterior oropharyngeal erythema or tonsillar abscesses.       Sinus pressure present, cough present, congestion present  Eyes: Conjunctivae and EOM are normal. Pupils are equal, round, and reactive to light. No scleral icterus.  Neck: Normal range of motion. Neck supple.  Cardiovascular: Normal rate, regular rhythm, normal heart sounds and intact distal pulses.   No murmur heard. Pulmonary/Chest: Effort normal and breath sounds normal. No respiratory distress. He has no wheezes. He has no rales.  Lymphadenopathy:    He has no cervical adenopathy.  Skin: Skin is warm and dry. No rash noted.      Assessment & Plan:

## 2011-09-13 NOTE — Patient Instructions (Signed)
You have a sinus infection. Take medicine as prescribed: augmentin in case not improving as expected.  Codeine cough syrup for bedtime (may make you sleepy) Push fluids and plenty of rest. Nasal saline irrigation or neti pot to help drain sinuses. May use simple mucinex with plenty of fluid to help mobilize mucous. Let us know if fever >101.5, trouble opening/closing mouth, difficulty swallowing, or worsening - you may need to be seen again.

## 2012-01-12 ENCOUNTER — Ambulatory Visit (INDEPENDENT_AMBULATORY_CARE_PROVIDER_SITE_OTHER): Payer: BC Managed Care – PPO | Admitting: Family Medicine

## 2012-01-12 ENCOUNTER — Encounter: Payer: Self-pay | Admitting: Family Medicine

## 2012-01-12 VITALS — BP 138/92 | HR 72 | Temp 98.3°F | Wt 242.0 lb

## 2012-01-12 DIAGNOSIS — R5383 Other fatigue: Secondary | ICD-10-CM

## 2012-01-12 DIAGNOSIS — R5381 Other malaise: Secondary | ICD-10-CM

## 2012-01-12 DIAGNOSIS — E291 Testicular hypofunction: Secondary | ICD-10-CM

## 2012-01-12 DIAGNOSIS — R7989 Other specified abnormal findings of blood chemistry: Secondary | ICD-10-CM

## 2012-01-12 LAB — CBC WITH DIFFERENTIAL/PLATELET
Basophils Absolute: 0 10*3/uL (ref 0.0–0.1)
Eosinophils Relative: 3 % (ref 0.0–5.0)
Lymphocytes Relative: 43.9 % (ref 12.0–46.0)
Lymphs Abs: 3.4 10*3/uL (ref 0.7–4.0)
Monocytes Relative: 10.8 % (ref 3.0–12.0)
Neutrophils Relative %: 41.7 % — ABNORMAL LOW (ref 43.0–77.0)
Platelets: 242 10*3/uL (ref 150.0–400.0)
RDW: 12.4 % (ref 11.5–14.6)
WBC: 7.7 10*3/uL (ref 4.5–10.5)

## 2012-01-12 LAB — COMPREHENSIVE METABOLIC PANEL
ALT: 46 U/L (ref 0–53)
Albumin: 4.1 g/dL (ref 3.5–5.2)
CO2: 30 mEq/L (ref 19–32)
Chloride: 98 mEq/L (ref 96–112)
GFR: 96.39 mL/min (ref >60.00)
Glucose, Bld: 107 mg/dL — ABNORMAL HIGH (ref 70–99)
Potassium: 3.5 mEq/L (ref 3.5–5.1)
Sodium: 137 mEq/L (ref 135–145)
Total Bilirubin: 0.8 mg/dL (ref 0.3–1.2)
Total Protein: 7.3 g/dL (ref 6.0–8.3)

## 2012-01-12 LAB — TSH: TSH: 1.92 u[IU]/mL (ref 0.35–5.50)

## 2012-01-12 NOTE — Patient Instructions (Addendum)
It was great to see you. I will call you with your lab results (likely tomorrow).

## 2012-01-12 NOTE — Progress Notes (Signed)
  Subjective:    Patient ID: Eric Pierce, male    DOB: 1960-12-01, 51 y.o.   MRN: [id number]  HPI  51 yo with progressive fatigue for past 2-3 months.  No CP or SOB. Feels like if he slept all day, still would not feel rested. Denies any anxiety or depression. He is having issues with decreased libido and inability to maintain an erection.  No blood in stool. No HA.  Brother CABG at 75 Dad- stents in 23s, prostate CA at 69  Appetite good, weight stable. Wt Readings from Last 3 Encounters:  01/12/12 242 lb (109.77 kg)  09/13/11 248 lb 4 oz (112.605 kg)  04/19/11 235 lb 4 oz (106.709 kg)     Review of Systems See HPI    Objective:   Physical Exam BP 138/92  Pulse 72  Temp 98.3 F (36.8 C)  Wt 242 lb (109.77 kg)  General:  overweght male in NAD Eyes:  PERRL Ears:  External ear exam shows no significant lesions or deformities.  Otoscopic examination reveals clear canals, tympanic membranes are intact bilaterally without bulging, retraction, inflammation or discharge. Hearing is grossly normal bilaterally. Nose:  External nasal examination shows no deformity or inflammation. Nasal mucosa are pink and moist without lesions or exudates. Mouth:  Oral mucosa and oropharynx without lesions or exudates.  Teeth in good repair. Neck:  no carotid bruit or thyromegaly no cervical or supraclavicular lymphadenopathy  Lungs:  Normal respiratory effort, chest expands symmetrically. Lungs are clear to auscultation, no crackles or wheezes. Heart:  Normal rate and regular rhythm. S1 and S2 normal without gallop, murmur, click, rub or other extra sounds. Abdomen:  Bowel sounds positive,abdomen soft and non-tender without masses, organomegaly or hernias noted. Pulses:  R and L posterior tibial pulses are full and equal bilaterally  Extremities:  no edema     Assessment & Plan:   1. Fatigue  CBC with Differential, Comprehensive metabolic panel, TSH, PSA, Testosterone, EKG 12-Lead    New- EKG NSR, reassuring. Will check labs today to rule out other contributing factors. Since ROS is neg other than sexual dysfunction, low testosterone as at top of diff dx. The patient indicates understanding of these issues and agrees with the plan.

## 2012-01-13 NOTE — Addendum Note (Signed)
Addended by: Dianne Dun on: 01/13/2012 01:37 PM   Modules accepted: Orders

## 2012-03-16 ENCOUNTER — Other Ambulatory Visit: Payer: Self-pay | Admitting: Otolaryngology

## 2012-06-09 ENCOUNTER — Other Ambulatory Visit: Payer: Self-pay | Admitting: Radiology

## 2012-06-09 ENCOUNTER — Telehealth: Payer: Self-pay

## 2012-06-09 DIAGNOSIS — Z136 Encounter for screening for cardiovascular disorders: Secondary | ICD-10-CM

## 2012-06-09 NOTE — Telephone Encounter (Signed)
Pt's wife left v/m would like to get cholesterol lab work;wants to schedule for 06/12/12.Please advise. Pt not having problem; been over 1 year since had checked. Please advise.

## 2012-06-09 NOTE — Telephone Encounter (Signed)
appt made for patient to have fasting labs

## 2012-06-09 NOTE — Telephone Encounter (Signed)
Ok to check CMET and lipid panel (v81.0)

## 2012-06-12 ENCOUNTER — Other Ambulatory Visit (INDEPENDENT_AMBULATORY_CARE_PROVIDER_SITE_OTHER): Payer: BC Managed Care – PPO

## 2012-06-12 DIAGNOSIS — Z136 Encounter for screening for cardiovascular disorders: Secondary | ICD-10-CM

## 2012-06-12 LAB — COMPREHENSIVE METABOLIC PANEL
Albumin: 4.2 g/dL (ref 3.5–5.2)
BUN: 13 mg/dL (ref 6–23)
CO2: 31 mEq/L (ref 19–32)
GFR: 82.22 mL/min (ref >60.00)
Glucose, Bld: 115 mg/dL — ABNORMAL HIGH (ref 70–99)
Sodium: 138 mEq/L (ref 135–145)
Total Bilirubin: 0.6 mg/dL (ref 0.3–1.2)
Total Protein: 7.8 g/dL (ref 6.0–8.3)

## 2012-06-12 LAB — LIPID PANEL
Cholesterol: 303 mg/dL — ABNORMAL HIGH (ref 0–200)
Triglycerides: 170 mg/dL — ABNORMAL HIGH (ref 0.0–149.0)
VLDL: 34 mg/dL (ref 0.0–40.0)

## 2012-06-13 ENCOUNTER — Encounter: Payer: Self-pay | Admitting: Family Medicine

## 2012-06-13 ENCOUNTER — Ambulatory Visit (INDEPENDENT_AMBULATORY_CARE_PROVIDER_SITE_OTHER): Payer: BC Managed Care – PPO | Admitting: Family Medicine

## 2012-06-13 VITALS — BP 124/84 | HR 84 | Temp 98.2°F | Wt 253.0 lb

## 2012-06-13 DIAGNOSIS — E782 Mixed hyperlipidemia: Secondary | ICD-10-CM

## 2012-06-13 MED ORDER — EZETIMIBE-SIMVASTATIN 10-20 MG PO TABS: 1.0000 | ORAL_TABLET | Freq: Every day | ORAL | Status: DC

## 2012-06-13 NOTE — Patient Instructions (Signed)
Good to see you. Restart the Vytorin. Please come back for labs in 8 weeks.

## 2012-06-13 NOTE — Progress Notes (Signed)
Subjective:    Patient ID: Eric Pierce, male    DOB: 07/14/1961, 51 y.o.   MRN: [id number]  HPI  51 yo here to discuss labs.  Lipids extremely elevated and LDL increased dramatically since last year- was 101, now 231.5!Marland Kitchen  Lab Results  Component Value Date   CHOL 303* 06/12/2012   HDL 36.50* 06/12/2012   LDLCALC 101* 04/19/2011   LDLDIRECT 231.5 06/12/2012   TRIG 170.0* 06/12/2012   CHOLHDL 8 06/12/2012    Was taking Vytorin 10-20 mg daily, Fish oil 1000 mg daily but stop taking his medications 10 months ago because he had a lot of other medical issues- has had multiple surgeries for mastoiditis and Meniere's disease. Has been on and off prednisone. Less active and has gained weight. Wt Readings from Last 3 Encounters:  06/13/12 253 lb (114.76 kg)  01/12/12 242 lb (109.77 kg)  09/13/11 248 lb 4 oz (112.605 kg)     Non smoker.  No h/o CAD.  Patient Active Problem List  Diagnosis  . VIRAL INFECTION  . HYPERLIPIDEMIA  . ANXIETY STATE NOS  . DISORDER, DEPRESSIVE NEC  . PROSTATITIS, ACUTE  . KERATOSIS  . SEBORRHEIC KERATOSIS  . 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 NECK PAIN, LEFT  . BACK PAIN  . DRY SKIN  . VERTIGO  . FATIGUE  . NECK MASS  . CHEST PAIN, NON-CARDIAC  . TRANSAMINASES, SERUM, ELEVATED  . Aphthous ulcer of mouth  . Meniere disease  . Acute sinusitis  . Fatigue   Past Medical History  Diagnosis Date  . Meniere disease    Past Surgical History  Procedure Date  . Shoulder surgery 04/2002    left  . Endolymphatic sac operation 02/2011    ENT - Dr. Joneen Roach   History  Substance Use Topics  . Smoking status: Never Smoker   . Smokeless tobacco: Not on file  . Alcohol Use: No   Family History  Problem Relation Age of Onset  . Heart disease Father   . Cancer Father     prostate  . Heart disease Brother    Allergies  Allergen Reactions  . Ciprofloxacin     REACTION: sleepy  . Ofloxacin    Current Outpatient Prescriptions  on File Prior to Visit  Medication Sig Dispense Refill  . aspirin (ASPIRIN LOW DOSE) 81 MG tablet Take 81 mg by mouth daily.        . cetirizine (ZYRTEC) 10 MG tablet Take 10 mg by mouth daily.      . chlorthalidone (HYGROTON) 25 MG tablet Take 25 mg by mouth 2 (two) times daily.        Marland Kitchen ezetimibe-simvastatin (VYTORIN) 10-20 MG per tablet Take 1 tablet by mouth daily.  30 tablet  11  . Omega-3 Fatty Acids (FISH OIL) 1000 MG CAPS Take 1 capsule by mouth daily.        . potassium chloride (KLOR-CON) 10 MEQ CR tablet Take 10 mEq by mouth 2 (two) times daily.         The PMH, PSH, Social History, Family History, Medications, and allergies have been reviewed in [facility], and have been updated if relevant.  Review of Systems    See HPI Objective:   Physical Exam BP 124/84  Pulse 84  Temp 98.2 F (36.8 C)  Wt 253 lb (114.76 kg)  Gen:  Alert, pleasant NAD Psych:  Good eye contact, not anxious or depressed appearing.      Assessment &  Plan:   1. HYPERLIPIDEMIA   Deteriorated. >15 min spent with face to face with patient, >50% counseling and/or coordinating care. Restart Vytorin and fish oil- pt agreed to be more compliant. Follow up labs in 8 weeks.

## 2012-08-07 ENCOUNTER — Other Ambulatory Visit (INDEPENDENT_AMBULATORY_CARE_PROVIDER_SITE_OTHER): Payer: BC Managed Care – PPO

## 2012-08-07 ENCOUNTER — Encounter: Payer: Self-pay | Admitting: *Deleted

## 2012-08-07 DIAGNOSIS — E782 Mixed hyperlipidemia: Secondary | ICD-10-CM

## 2012-08-07 LAB — COMPREHENSIVE METABOLIC PANEL
AST: 36 U/L (ref 0–37)
BUN: 13 mg/dL (ref 6–23)
Calcium: 9.3 mg/dL (ref 8.4–10.5)
Chloride: 101 mEq/L (ref 96–112)
Creatinine, Ser: 1 mg/dL (ref 0.4–1.5)
Glucose, Bld: 107 mg/dL — ABNORMAL HIGH (ref 70–99)

## 2012-08-07 LAB — LIPID PANEL
Cholesterol: 170 mg/dL (ref 0–200)
HDL: 37.3 mg/dL — ABNORMAL LOW (ref >39.00)
Total CHOL/HDL Ratio: 5
Triglycerides: 111 mg/dL (ref 0.0–149.0)

## 2012-10-26 ENCOUNTER — Ambulatory Visit: Payer: BC Managed Care – PPO | Admitting: Rehabilitative and Restorative Service Providers"

## 2012-10-31 ENCOUNTER — Encounter: Payer: BC Managed Care – PPO | Admitting: Rehabilitative and Restorative Service Providers"

## 2012-11-01 ENCOUNTER — Ambulatory Visit: Payer: BC Managed Care – PPO | Attending: Otolaryngology | Admitting: Rehabilitative and Restorative Service Providers"

## 2012-11-01 DIAGNOSIS — R269 Unspecified abnormalities of gait and mobility: Secondary | ICD-10-CM | POA: Insufficient documentation

## 2012-11-01 DIAGNOSIS — IMO0001 Reserved for inherently not codable concepts without codable children: Secondary | ICD-10-CM | POA: Insufficient documentation

## 2012-11-07 ENCOUNTER — Encounter: Payer: BC Managed Care – PPO | Admitting: Rehabilitative and Restorative Service Providers"

## 2012-11-17 ENCOUNTER — Ambulatory Visit: Payer: BC Managed Care – PPO | Admitting: Rehabilitative and Restorative Service Providers"

## 2012-11-20 ENCOUNTER — Ambulatory Visit: Payer: BC Managed Care – PPO | Admitting: Rehabilitative and Restorative Service Providers"

## 2013-08-27 ENCOUNTER — Other Ambulatory Visit: Payer: Self-pay | Admitting: Family Medicine

## 2013-08-27 NOTE — Telephone Encounter (Signed)
Patients last office visit was 06/13/12. Last Lipid panel was 08/07/12.  Ok to refill?

## 2013-08-27 NOTE — Telephone Encounter (Signed)
Ok to refill one time only.  Needs to be seen for further refills. 

## 2013-11-15 ENCOUNTER — Encounter: Payer: Self-pay | Admitting: Internal Medicine

## 2013-11-15 ENCOUNTER — Ambulatory Visit (INDEPENDENT_AMBULATORY_CARE_PROVIDER_SITE_OTHER): Payer: BC Managed Care – PPO | Admitting: Family Medicine

## 2013-11-15 ENCOUNTER — Encounter: Payer: Self-pay | Admitting: Family Medicine

## 2013-11-15 VITALS — BP 144/86 | HR 86 | Temp 98.3°F | Wt 246.5 lb

## 2013-11-15 DIAGNOSIS — E785 Hyperlipidemia, unspecified: Secondary | ICD-10-CM

## 2013-11-15 DIAGNOSIS — R030 Elevated blood-pressure reading, without diagnosis of hypertension: Secondary | ICD-10-CM

## 2013-11-15 DIAGNOSIS — H8109 Meniere's disease, unspecified ear: Secondary | ICD-10-CM

## 2013-11-15 DIAGNOSIS — Z125 Encounter for screening for malignant neoplasm of prostate: Secondary | ICD-10-CM

## 2013-11-15 DIAGNOSIS — Z1211 Encounter for screening for malignant neoplasm of colon: Secondary | ICD-10-CM

## 2013-11-15 DIAGNOSIS — Z0000 Encounter for general adult medical examination without abnormal findings: Secondary | ICD-10-CM

## 2013-11-15 DIAGNOSIS — E782 Mixed hyperlipidemia: Secondary | ICD-10-CM

## 2013-11-15 DIAGNOSIS — IMO0001 Reserved for inherently not codable concepts without codable children: Secondary | ICD-10-CM | POA: Insufficient documentation

## 2013-11-15 LAB — COMPREHENSIVE METABOLIC PANEL
ALBUMIN: 4.2 g/dL (ref 3.5–5.2)
ALT: 67 U/L — ABNORMAL HIGH (ref 0–53)
AST: 33 U/L (ref 0–37)
Alkaline Phosphatase: 85 U/L (ref 39–117)
BUN: 12 mg/dL (ref 6–23)
CALCIUM: 9.7 mg/dL (ref 8.4–10.5)
CHLORIDE: 103 meq/L (ref 96–112)
CO2: 27 meq/L (ref 19–32)
Creatinine, Ser: 0.9 mg/dL (ref 0.4–1.5)
GFR: 96.93 mL/min (ref >60.00)
GLUCOSE: 104 mg/dL — AB (ref 70–99)
POTASSIUM: 4.1 meq/L (ref 3.5–5.1)
Sodium: 140 mEq/L (ref 135–145)
Total Bilirubin: 1 mg/dL (ref 0.3–1.2)
Total Protein: 7.3 g/dL (ref 6.0–8.3)

## 2013-11-15 LAB — CBC WITH DIFFERENTIAL/PLATELET
BASOS PCT: 0.5 % (ref 0.0–3.0)
Basophils Absolute: 0 10*3/uL (ref 0.0–0.1)
EOS PCT: 3.3 % (ref 0.0–5.0)
Eosinophils Absolute: 0.3 10*3/uL (ref 0.0–0.7)
HCT: 48.2 % (ref 39.0–52.0)
HEMOGLOBIN: 16.6 g/dL (ref 13.0–17.0)
LYMPHS PCT: 44.4 % (ref 12.0–46.0)
Lymphs Abs: 3.7 10*3/uL (ref 0.7–4.0)
MCHC: 34.4 g/dL (ref 30.0–36.0)
MCV: 91.1 fl (ref 78.0–100.0)
Monocytes Absolute: 0.8 10*3/uL (ref 0.1–1.0)
Monocytes Relative: 10.1 % (ref 3.0–12.0)
NEUTROS PCT: 41.7 % — AB (ref 43.0–77.0)
Neutro Abs: 3.4 10*3/uL (ref 1.4–7.7)
Platelets: 252 10*3/uL (ref 150.0–400.0)
RBC: 5.29 Mil/uL (ref 4.22–5.81)
RDW: 12.9 % (ref 11.5–14.6)
WBC: 8.2 10*3/uL (ref 4.5–10.5)

## 2013-11-15 LAB — LIPID PANEL
CHOLESTEROL: 198 mg/dL (ref 0–200)
HDL: 40.6 mg/dL (ref >39.00)
LDL Cholesterol: 134 mg/dL — ABNORMAL HIGH (ref 0–99)
Total CHOL/HDL Ratio: 5
Triglycerides: 116 mg/dL (ref 0.0–149.0)
VLDL: 23.2 mg/dL (ref 0.0–40.0)

## 2013-11-15 LAB — PSA: PSA: 0.43 ng/mL (ref 0.10–4.00)

## 2013-11-15 NOTE — Progress Notes (Signed)
Pre visit review using our clinic review tool, if applicable. No additional management support is needed unless otherwise documented below in the visit note. 

## 2013-11-15 NOTE — Assessment & Plan Note (Signed)
Almost at goal and he does admit to areas that he could improve in his diet.  I would prefer for him to make dietary/lifestyle changes rather than starting medication at this point as meds could exacerbate his meniere's symptoms.  Given copy of DASH diet. Call or return to clinic prn if these symptoms worsen or fail to improve as anticipated.  The patient indicates understanding of these issues and agrees with the plan.

## 2013-11-15 NOTE — Progress Notes (Signed)
Subjective:    Patient ID: Eric Pierce, male    DOB: 08/22/1960, 53 y.o.   MRN: [id number]  HPI  53 yo whom I have not seen since 05/2012, here for multiple issues.  Meniere's much better- seeing Dr. Dorma RussellKraus.  S/p 2 surgeries and gentamycin injections.  Has not had symptoms in over 1/2 years (used to occur at least 3 times weekly)- vertigo with vomiting.  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  In future, probably needs hearing add.  HLD- taking Vytorin every other day because it causes fatigue.  Lab Results  Component Value Date   CHOL 170 08/07/2012   HDL 37.30* 08/07/2012   LDLCALC 111* 08/07/2012   LDLDIRECT 231.5 06/12/2012   TRIG 111.0 08/07/2012   CHOLHDL 5 08/07/2012   Has been less active due to meniere's.  Weight stable but has gained weight in last couple of years. Wt Readings from Last 3 Encounters:  11/15/13 246 lb 8 oz (111.812 kg)  06/13/12 253 lb (114.76 kg)  01/12/12 242 lb (109.77 kg)   Bp has also been slowly trending up at Dr. Donaciano EvaKraus's office.  Denies any HA, blurred vision, CP or SOB.    Has had never had a colonoscopy.  Does have FH of colon CA- grandmother (diagnosed in 6060s or 6370s).  No changes in bowel habits, no blood in stool.    Patient Active Problem List   Diagnosis Date Noted  . Elevated blood pressure 11/15/2013  . Meniere disease 04/19/2011  . FATIGUE 03/20/2009  . BACK PAIN 08/23/2007  . TRANSAMINASES, SERUM, ELEVATED 08/23/2007  . HYPERLIPIDEMIA 08/17/2007  . DRY SKIN 08/17/2007  . ANXIETY STATE NOS 11/01/2006  . CHEST PAIN, NON-CARDIAC 10/28/2006  . 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 DISORDER, DEPRESSIVE NEC 02/23/2006   Past Medical History  Diagnosis Date  . Meniere disease    Past Surgical History  Procedure Laterality Date  . Shoulder surgery  04/2002    left  . Endolymphatic sac operation  02/2011    ENT - Dr. Joneen Roachrosley   History  Substance Use Topics  . Smoking status: Never Smoker   . Smokeless  tobacco: Not on file  . Alcohol Use: No   Family History  Problem Relation Age of Onset  . Heart disease Father   . Cancer Father     prostate  . Heart disease Brother    Allergies  Allergen Reactions  . Ciprofloxacin     REACTION: sleepy  . Ofloxacin    Current Outpatient Prescriptions on File Prior to Visit  Medication Sig Dispense Refill  . aspirin (ASPIRIN LOW DOSE) 81 MG tablet Take 81 mg by mouth daily.        . cetirizine (ZYRTEC) 10 MG tablet Take 10 mg by mouth daily.      . montelukast (SINGULAIR) 10 MG tablet Take 10 mg by mouth at bedtime.      . Omega-3 Fatty Acids (FISH OIL) 1000 MG CAPS Take 1 capsule by mouth daily.         No current facility-administered medications on file prior to visit.   The PMH, PSH, Social History, Family History, Medications, and allergies [facility], and have been updated if relevant.  Review of Systems    See HPI Objective:   Physical Exam BP 144/86  Pulse 86  Temp(Src) 98.3 F (36.8 C) (Oral)  Wt 246 lb 8 oz (111.812 kg)  SpO2 98%  Gen:  Alert, pleasant NAD Resp:  CTA bilaterally CVS:  RRR Psych:  Good eye contact, not anxious or depressed appearing.      Assessment & Plan:   HLD (hyperlipidemia) - Plan: Lipid panel  Screening for prostate cancer - Plan: PSA  Routine general medical examination at a health care facility - Plan: CBC with Differential, Comprehensive metabolic panel  Screening for colon cancer - Plan: Ambulatory referral to Gastroenterology  Elevated blood pressure  Meniere disease  HYPERLIPIDEMIA

## 2013-11-15 NOTE — Assessment & Plan Note (Signed)
Symptoms improving.  Followed by Dr. Dorma RussellKraus.  Sees him again next month.

## 2013-11-15 NOTE — Patient Instructions (Addendum)
We will call you or the GI office will call you with your GI referral.     Specialist appointment times vary a great deal, mostly on the specialist's schedule and if they have openings.  -- Our office tries to get you in as fast as possible.  -- If you have a true emergency like new cancer, we work to get you in ASAP.   We will call you with your lab results.    DASH Diet The DASH diet stands for "Dietary Approaches to Stop Hypertension." It is a healthy eating plan that has been shown to reduce high blood pressure (hypertension) in as little as 14 days, while also possibly providing other significant health benefits. These other health benefits include reducing the risk of breast cancer after menopause and reducing the risk of type 2 diabetes, heart disease, colon cancer, and stroke. Health benefits also include weight loss and slowing kidney failure in patients with chronic kidney disease.  DIET GUIDELINES  Limit salt (sodium). Your diet should contain less than 1500 mg of sodium daily.  Limit refined or processed carbohydrates. Your diet should include mostly whole grains. Desserts and added sugars should be used sparingly.  Include small amounts of heart-healthy fats. These types of fats include nuts, oils, and tub margarine. Limit saturated and trans fats. These fats have been shown to be harmful in the body. CHOOSING FOODS  The following food groups are based on a 2000 calorie diet. See your Registered Dietitian for individual calorie needs. Grains and Grain Products (6 to 8 servings daily)  Eat More Often: Whole-wheat bread, brown rice, whole-grain or wheat pasta, quinoa, popcorn without added fat or salt (air popped).  Eat Less Often: White bread, white pasta, white rice, cornbread. Vegetables (4 to 5 servings daily)  Eat More Often: Fresh, frozen, and canned vegetables. Vegetables may be raw, steamed, roasted, or grilled with a minimal amount of fat.  Eat Less Often/Avoid:  Creamed or fried vegetables. Vegetables in a cheese sauce. Fruit (4 to 5 servings daily)  Eat More Often: All fresh, canned (in natural juice), or frozen fruits. Dried fruits without added sugar. One hundred percent fruit juice ( cup [237 mL] daily).  Eat Less Often: Dried fruits with added sugar. Canned fruit in light or heavy syrup. Foot LockerLean Meats, Fish, and Poultry (2 servings or less daily. One serving is 3 to 4 oz [85-114 g]).  Eat More Often: Ninety percent or leaner ground beef, tenderloin, sirloin. Round cuts of beef, chicken breast, [place]key breast. All fish. Grill, bake, or broil your meat. Nothing should be fried.  Eat Less Often/Avoid: Fatty cuts of meat, [place]key, or chicken leg, thigh, or wing. Fried cuts of meat or fish. Dairy (2 to 3 servings)  Eat More Often: Low-fat or fat-free milk, low-fat plain or light yogurt, reduced-fat or part-skim cheese.  Eat Less Often/Avoid: Milk (whole, 2%).Whole milk yogurt. Full-fat cheeses. Nuts, Seeds, and Legumes (4 to 5 servings per week)  Eat More Often: All without added salt.  Eat Less Often/Avoid: Salted nuts and seeds, canned beans with added salt. Fats and Sweets (limited)  Eat More Often: Vegetable oils, tub margarines without trans fats, sugar-free gelatin. Mayonnaise and salad dressings.  Eat Less Often/Avoid: Coconut oils, palm oils, butter, stick margarine, cream, half and half, cookies, candy, pie. FOR MORE INFORMATION The Dash Diet Eating Plan: www.dashdiet.org Document Released: 07/01/2011 Document Revised: 10/04/2011 Document Reviewed: 07/01/2011 [facility] Patient Information 2014 [place].

## 2013-11-15 NOTE — Assessment & Plan Note (Signed)
Due for labs. Orders Placed This Encounter  Procedures  . CBC with Differential  . Comprehensive metabolic panel  . Lipid panel  . PSA  . Ambulatory referral to Gastroenterology

## 2013-11-15 NOTE — Assessment & Plan Note (Signed)
Since I have not seen him for CPX in years, I did discuss colon CA screening with him today.  He is in agreement with referral to GI for screening colonoscopy.  Referral placed.

## 2013-11-16 ENCOUNTER — Encounter: Payer: Self-pay | Admitting: *Deleted

## 2013-11-27 ENCOUNTER — Other Ambulatory Visit: Payer: Self-pay | Admitting: Family Medicine

## 2013-11-27 NOTE — Telephone Encounter (Signed)
I'm ok with refilling it if he wants to continue this rx.

## 2013-11-27 NOTE — Telephone Encounter (Signed)
Received refill request from pharmacy. Patient has not called back about changing medication. Is it okay to refill?

## 2013-11-27 NOTE — Telephone Encounter (Signed)
Spoke to patient and was advised that he does want to continue his current medication. Refill sent to pharmacy.

## 2014-01-18 ENCOUNTER — Encounter: Payer: BC Managed Care – PPO | Admitting: Internal Medicine

## 2014-10-28 ENCOUNTER — Ambulatory Visit: Payer: BC Managed Care – PPO | Admitting: Family Medicine

## 2014-11-04 ENCOUNTER — Ambulatory Visit (INDEPENDENT_AMBULATORY_CARE_PROVIDER_SITE_OTHER): Payer: BC Managed Care – PPO | Admitting: Family Medicine

## 2014-11-04 ENCOUNTER — Encounter: Payer: Self-pay | Admitting: Family Medicine

## 2014-11-04 VITALS — BP 108/66 | HR 89 | Temp 98.0°F | Resp 18 | Wt 241.8 lb

## 2014-11-04 DIAGNOSIS — R1030 Lower abdominal pain, unspecified: Secondary | ICD-10-CM | POA: Diagnosis not present

## 2014-11-04 DIAGNOSIS — R030 Elevated blood-pressure reading, without diagnosis of hypertension: Secondary | ICD-10-CM | POA: Insufficient documentation

## 2014-11-04 DIAGNOSIS — E782 Mixed hyperlipidemia: Secondary | ICD-10-CM | POA: Diagnosis not present

## 2014-11-04 LAB — CBC WITH DIFFERENTIAL/PLATELET
Basophils Absolute: 0 10*3/uL (ref 0.0–0.1)
Basophils Relative: 0.6 % (ref 0.0–3.0)
EOS PCT: 4 % (ref 0.0–5.0)
Eosinophils Absolute: 0.3 10*3/uL (ref 0.0–0.7)
HEMATOCRIT: 47 % (ref 39.0–52.0)
HEMOGLOBIN: 16.2 g/dL (ref 13.0–17.0)
LYMPHS ABS: 2.8 10*3/uL (ref 0.7–4.0)
LYMPHS PCT: 37.4 % (ref 12.0–46.0)
MCHC: 34.5 g/dL (ref 30.0–36.0)
MCV: 90 fl (ref 78.0–100.0)
Monocytes Absolute: 0.6 10*3/uL (ref 0.1–1.0)
Monocytes Relative: 7.4 % (ref 3.0–12.0)
Neutro Abs: 3.9 10*3/uL (ref 1.4–7.7)
Neutrophils Relative %: 50.6 % (ref 43.0–77.0)
Platelets: 251 10*3/uL (ref 150.0–400.0)
RBC: 5.22 Mil/uL (ref 4.22–5.81)
RDW: 12.6 % (ref 11.5–15.5)
WBC: 7.6 10*3/uL (ref 4.0–10.5)

## 2014-11-04 LAB — COMPREHENSIVE METABOLIC PANEL
ALT: 41 U/L (ref 0–53)
AST: 22 U/L (ref 0–37)
Albumin: 4.2 g/dL (ref 3.5–5.2)
Alkaline Phosphatase: 86 U/L (ref 39–117)
BUN: 13 mg/dL (ref 6–23)
CO2: 28 mEq/L (ref 19–32)
Calcium: 9.6 mg/dL (ref 8.4–10.5)
Chloride: 106 mEq/L (ref 96–112)
Creatinine, Ser: 0.94 mg/dL (ref 0.40–1.50)
GFR: 89.49 mL/min (ref >60.00)
Glucose, Bld: 109 mg/dL — ABNORMAL HIGH (ref 70–99)
POTASSIUM: 4.4 meq/L (ref 3.5–5.1)
Sodium: 139 mEq/L (ref 135–145)
Total Bilirubin: 0.6 mg/dL (ref 0.2–1.2)
Total Protein: 6.9 g/dL (ref 6.0–8.3)

## 2014-11-04 LAB — POCT URINALYSIS DIPSTICK
BILIRUBIN UA: NEGATIVE
Blood, UA: NEGATIVE
GLUCOSE UA: NEGATIVE
KETONES UA: NEGATIVE
LEUKOCYTES UA: NEGATIVE
NITRITE UA: NEGATIVE
Protein, UA: POSITIVE
Spec Grav, UA: 1.015
Urobilinogen, UA: 0.2
pH, UA: 7.5

## 2014-11-04 LAB — LIPID PANEL
CHOLESTEROL: 171 mg/dL (ref 0–200)
HDL: 42.1 mg/dL (ref >39.00)
LDL Cholesterol: 104 mg/dL — ABNORMAL HIGH (ref 0–99)
NonHDL: 128.9
TRIGLYCERIDES: 125 mg/dL (ref 0.0–149.0)
Total CHOL/HDL Ratio: 4
VLDL: 25 mg/dL (ref 0.0–40.0)

## 2014-11-04 LAB — PSA: PSA: 0.47 ng/mL (ref 0.10–4.00)

## 2014-11-04 MED ORDER — CIPROFLOXACIN HCL 500 MG PO TABS: 500.0000 mg | ORAL_TABLET | Freq: Two times a day (BID) | ORAL | Status: DC

## 2014-11-04 NOTE — Assessment & Plan Note (Signed)
Deteriorated. 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  UA neg.  Treat with cipro, check labs including CBC and PSA today.  If symptoms do not improve within a few days, refer back to urology. He will call me if he develops fever, hematuria, more difficulty urinating, or any new symptoms. The patient indicates understanding of these issues and agrees with the plan.

## 2014-11-04 NOTE — Progress Notes (Signed)
Subjective:    Patient ID: Eric Pierce, male    DOB: Sep 19, 1960, 54 y.o.   MRN: [id number]  HPI  54 yo whom I have not seen in over a year, here with his wife for several issues.    Meniere's much better- seeing Dr. Dorma Russell.  S/p 2 surgeries and gentamycin injections.  Rarely has to take Valium now.  HLD- taking Vytorin every other day because it causes fatigue.  Very strong FH of HLD.  Lab Results  Component Value Date   CHOL 198 11/15/2013   HDL 40.60 11/15/2013   LDLCALC 134* 11/15/2013   LDLDIRECT 231.5 06/12/2012   TRIG 116.0 11/15/2013   CHOLHDL 5 11/15/2013   Has been less active due to meniere's.  Weight stable but has gained weight in last couple of years. Wt Readings from Last 3 Encounters:  11/04/14 241 lb 12.8 oz (109.68 kg)  11/15/13 246 lb 8 oz (111.812 kg)  06/13/12 253 lb (114.76 kg)   Bp has also been slowly trending up at Dr. Donaciano Eva office.  Denies any HA, blurred vision, CP or SOB.     Groin pain- h/o recurrent prostatitis and he feels these symptoms are consistent with his previous symptoms.  No fevers, chills, nausea or vomiting.  No dysuria.  Pain is mainly in his groin area- hard to localize for him, sometimes radiates to his back.   Patient Active Problem List   Diagnosis Date Noted  . Groin pain 11/04/2014  . Elevated blood pressure (not hypertension) 11/04/2014  . Elevated blood pressure 11/15/2013  . Special screening for malignant neoplasms, colon 11/15/2013  . Meniere disease 04/19/2011  . FATIGUE 03/20/2009  . BACK PAIN 08/23/2007  . TRANSAMINASES, SERUM, ELEVATED 08/23/2007  . HYPERLIPIDEMIA 08/17/2007  . DRY SKIN 08/17/2007  . ANXIETY STATE NOS 11/01/2006  . CHEST PAIN, NON-CARDIAC 10/28/2006  . 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 DISORDER, DEPRESSIVE NEC 02/23/2006   Past Medical History  Diagnosis Date  . Meniere disease    Past Surgical History  Procedure Laterality Date  . Shoulder surgery  04/2002     left  . Endolymphatic sac operation  02/2011    ENT - Dr. Joneen Roach   History  Substance Use Topics  . Smoking status: Never Smoker   . Smokeless tobacco: Not on file  . Alcohol Use: No   Family History  Problem Relation Age of Onset  . Heart disease Father   . Cancer Father     prostate  . Heart disease Brother    Allergies  Allergen Reactions  . Ciprofloxacin     REACTION: sleepy  . Ofloxacin    Current Outpatient Prescriptions on File Prior to Visit  Medication Sig Dispense Refill  . aspirin (ASPIRIN LOW DOSE) 81 MG tablet Take 81 mg by mouth daily.      . cetirizine (ZYRTEC) 10 MG tablet Take 10 mg by mouth daily.    . diazepam (VALIUM) 5 MG tablet Take 1 tablet by mouth as needed.    . ezetimibe-simvastatin (VYTORIN) 10-20 MG per tablet TAKE ONE (1) TABLET BY MOUTH EVERY OTHER DAY    . Omega-3 Fatty Acids (FISH OIL) 1000 MG CAPS Take 1 capsule by mouth daily.      Marland Kitchen triamterene-hydrochlorothiazide (DYAZIDE) 37.5-25 MG per capsule Take 1 capsule by mouth daily.    . montelukast (SINGULAIR) 10 MG tablet Take 10 mg by mouth at bedtime.     No current facility-administered medications on file  prior to visit.   The PMH, PSH, Social History, Family History, Medications, and allergies have [facility], and have been updated if relevant.  Review of Systems  Constitutional: Negative.   HENT: Negative.   Respiratory: Negative.   Cardiovascular: Negative.   Gastrointestinal: Negative for nausea, vomiting, diarrhea, constipation, blood in stool, abdominal distention, anal bleeding and rectal pain.  Genitourinary: Positive for difficulty urinating. Negative for dysuria, urgency, frequency, hematuria, flank pain, decreased urine volume, discharge, penile swelling, enuresis, penile pain and testicular pain.  Musculoskeletal: Positive for back pain.  Neurological: Negative.   Hematological: Negative.   All other systems reviewed and are negative.    Objective:    Physical Exam  Constitutional: He is oriented to person, place, and time. He appears well-developed and well-nourished. No distress.  HENT:  Head: Normocephalic.  Eyes: Conjunctivae are normal.  Neck: Normal range of motion.  Cardiovascular: Normal rate and regular rhythm.   Pulmonary/Chest: Effort normal and breath sounds normal. No respiratory distress.  Abdominal: Soft. He exhibits no distension. There is no tenderness. There is no rebound and no guarding.  Genitourinary: Penis normal.  Prostate exam deferred given his level of discomfort.  Neurological: He is alert and oriented to person, place, and time. No cranial nerve deficit.  Skin: Skin is warm and dry.  Psychiatric: He has a normal mood and affect. His behavior is normal. Judgment and thought content normal.   BP 108/66 mmHg  Pulse 89  Temp(Src) 98 F (36.7 C) (Oral)  Resp 18  Wt 241 lb 12.8 oz (109.68 kg)  SpO2 98%        Assessment & Plan:   HYPERLIPIDEMIA - Plan: Comprehensive metabolic panel, Lipid panel  Groin pain, unspecified laterality - Plan: CBC with Differential/Platelet, PSA  Elevated blood pressure (not hypertension)

## 2014-11-04 NOTE — Assessment & Plan Note (Signed)
Long standing issue. Continue current rx. Check labs today. The patient indicates understanding of these issues and agrees with the plan.

## 2014-11-04 NOTE — Assessment & Plan Note (Signed)
Reassurance provided. Normotensive here and asymptomatic. Follow up prn. The patient indicates understanding of these issues and agrees with the plan.

## 2014-11-04 NOTE — Patient Instructions (Signed)
Good to see you. Please take cipro as directed- 1 tablet twice daily for 6 weeks. Please update me with your symptoms. We will call you with your lab results.

## 2014-11-05 ENCOUNTER — Encounter: Payer: Self-pay | Admitting: *Deleted

## 2015-01-14 ENCOUNTER — Ambulatory Visit (INDEPENDENT_AMBULATORY_CARE_PROVIDER_SITE_OTHER): Payer: BC Managed Care – PPO | Admitting: Family Medicine

## 2015-01-14 ENCOUNTER — Encounter: Payer: Self-pay | Admitting: Family Medicine

## 2015-01-14 VITALS — BP 128/70 | HR 89 | Temp 98.4°F | Wt 236.0 lb

## 2015-01-14 DIAGNOSIS — R5383 Other fatigue: Secondary | ICD-10-CM | POA: Insufficient documentation

## 2015-01-14 DIAGNOSIS — Z1211 Encounter for screening for malignant neoplasm of colon: Secondary | ICD-10-CM | POA: Diagnosis not present

## 2015-01-14 LAB — CBC WITH DIFFERENTIAL/PLATELET
BASOS ABS: 0.1 10*3/uL (ref 0.0–0.1)
BASOS PCT: 0.7 % (ref 0.0–3.0)
Eosinophils Absolute: 0.2 10*3/uL (ref 0.0–0.7)
Eosinophils Relative: 3.2 % (ref 0.0–5.0)
HCT: 47.4 % (ref 39.0–52.0)
HEMOGLOBIN: 16.1 g/dL (ref 13.0–17.0)
LYMPHS PCT: 43.4 % (ref 12.0–46.0)
Lymphs Abs: 3.3 10*3/uL (ref 0.7–4.0)
MCHC: 34.1 g/dL (ref 30.0–36.0)
MCV: 91.6 fl (ref 78.0–100.0)
Monocytes Absolute: 0.8 10*3/uL (ref 0.1–1.0)
Monocytes Relative: 10.7 % (ref 3.0–12.0)
NEUTROS ABS: 3.2 10*3/uL (ref 1.4–7.7)
Neutrophils Relative %: 42 % — ABNORMAL LOW (ref 43.0–77.0)
Platelets: 237 10*3/uL (ref 150.0–400.0)
RBC: 5.17 Mil/uL (ref 4.22–5.81)
RDW: 12.1 % (ref 11.5–15.5)
WBC: 7.7 10*3/uL (ref 4.0–10.5)

## 2015-01-14 LAB — COMPREHENSIVE METABOLIC PANEL
ALBUMIN: 4.3 g/dL (ref 3.5–5.2)
ALK PHOS: 83 U/L (ref 39–117)
ALT: 33 U/L (ref 0–53)
AST: 22 U/L (ref 0–37)
BUN: 14 mg/dL (ref 6–23)
CO2: 29 mEq/L (ref 19–32)
Calcium: 9.8 mg/dL (ref 8.4–10.5)
Chloride: 102 mEq/L (ref 96–112)
Creatinine, Ser: 0.95 mg/dL (ref 0.40–1.50)
GFR: 88.33 mL/min (ref >60.00)
GLUCOSE: 103 mg/dL — AB (ref 70–99)
POTASSIUM: 4.1 meq/L (ref 3.5–5.1)
SODIUM: 138 meq/L (ref 135–145)
TOTAL PROTEIN: 7 g/dL (ref 6.0–8.3)
Total Bilirubin: 0.7 mg/dL (ref 0.2–1.2)

## 2015-01-14 LAB — TSH: TSH: 1.97 u[IU]/mL (ref 0.35–4.50)

## 2015-01-14 LAB — TESTOSTERONE: TESTOSTERONE: 409.47 ng/dL (ref 300.00–890.00)

## 2015-01-14 LAB — VITAMIN B12: VITAMIN B 12: 632 pg/mL (ref 211–911)

## 2015-01-14 NOTE — Progress Notes (Signed)
Subjective:   Patient ID: Eric Pierce, male    DOB: 02/17/1961, 54 y.o.   MRN: [id number]  Eric Pierce is a pleasant 54 y.o. year old male with h/o HLD and meniere's disease,  who presents to clinic today with Fatigue  on 01/14/2015  HPI:  Past 3 weeks, progressive fatigue.  Feels he is sleeping ok.  Wife says he is not snoring at night.  Feels "like I'm dragging." No changes in work schedule or diet.  Has noticed decreased sex drive.  Has more anxiety at work so he thought that was related but he denies feeling depressed.  Has not noticed any blood in his stool.  Has never had a colonoscopy- referred last year but he cancelled appointment.  No CP or SOB- very physically active- throws bails of hay.  Has had numerous tick bites over past few weeks- denies fevers, myalgias, headaches or visual changes.  Lab Results  Component Value Date   TSH 1.92 01/12/2012   Lab Results  Component Value Date   WBC 7.6 11/04/2014   HGB 16.2 11/04/2014   HCT 47.0 11/04/2014   MCV 90.0 11/04/2014   PLT 251.0 11/04/2014   Lab Results  Component Value Date   PSA 0.47 11/04/2014   PSA 0.43 11/15/2013   PSA 0.49 01/12/2012   Lab Results  Component Value Date   NA 139 11/04/2014   K 4.4 11/04/2014   CL 106 11/04/2014   CO2 28 11/04/2014   Lab Results  Component Value Date   ALT 41 11/04/2014   AST 22 11/04/2014   ALKPHOS 86 11/04/2014   BILITOT 0.6 11/04/2014   Current Outpatient Prescriptions on File Prior to Visit  Medication Sig Dispense Refill  . aspirin (ASPIRIN LOW DOSE) 81 MG tablet Take 81 mg by mouth daily.      . cetirizine (ZYRTEC) 10 MG tablet Take 10 mg by mouth daily.    . diazepam (VALIUM) 5 MG tablet Take 1 tablet by mouth as needed.    . ezetimibe-simvastatin (VYTORIN) 10-20 MG per tablet TAKE ONE (1) TABLET BY MOUTH EVERY OTHER DAY    . Omega-3 Fatty Acids (FISH OIL) 1000 MG CAPS Take 1 capsule by mouth daily.      Marland Kitchen triamterene-hydrochlorothiazide  (DYAZIDE) 37.5-25 MG per capsule Take 1 capsule by mouth daily.     No current facility-administered medications on file prior to visit.    No Active Allergies  Past Medical History  Diagnosis Date  . Meniere disease     Past Surgical History  Procedure Laterality Date  . Shoulder surgery  04/2002    left  . Endolymphatic sac operation  02/2011    ENT - Dr. Joneen Roach    Family History  Problem Relation Age of Onset  . Heart disease Father   . Cancer Father     prostate  . Heart disease Brother     History   Social History  . Marital Status: Married    Spouse Name: N/A  . Number of Children: 2  . Years of Education: N/A   Occupational History  . Landscaper    Social History Main Topics  . Smoking status: Never Smoker   . Smokeless tobacco: Not on file  . Alcohol Use: No  . Drug Use: No  . Sexual Activity: Not on file   Other Topics Concern  . Not on file   Social History Narrative   The PMH, PSH, Social History, Family History, Medications,  and allergies have been reviewed in [facility], and have been updated if relevant.   Review of Systems  Constitutional: Positive for fatigue. Negative for unexpected weight change.  HENT: Negative.   Eyes: Negative.   Respiratory: Negative.   Cardiovascular: Negative.   Gastrointestinal: Negative.   Endocrine: Negative.   Genitourinary: Negative.   Musculoskeletal: Negative.   Skin: Negative.   Allergic/Immunologic: Negative.   Neurological: Negative.   Hematological: Negative.   Psychiatric/Behavioral: Negative.   All other systems reviewed and are negative.      Objective:    BP 128/70 mmHg  Pulse 89  Temp(Src) 98.4 F (36.9 C) (Tympanic)  Wt 236 lb (107.049 kg)  SpO2 98%  Wt Readings from Last 3 Encounters:  01/14/15 236 lb (107.049 kg)  11/04/14 241 lb 12.8 oz (109.68 kg)  11/15/13 246 lb 8 oz (111.812 kg)    Physical Exam  Constitutional: He is oriented to person, place, and time. He appears  well-developed and well-nourished. No distress.  HENT:  Head: Normocephalic.  Eyes: Conjunctivae are normal.  Neck: Neck supple.  Cardiovascular: Normal rate, regular rhythm and normal heart sounds.   Pulmonary/Chest: Effort normal and breath sounds normal. No respiratory distress. He has no wheezes.  Abdominal: Soft.  Musculoskeletal: Normal range of motion. He exhibits no edema.  Neurological: He is alert and oriented to person, place, and time. No cranial nerve deficit.  Skin: Skin is warm and dry. No rash noted. No erythema. No pallor.  Small circular raised scattered erythematous lesions on back- previous tick bites  Psychiatric: He has a normal mood and affect. His behavior is normal. Judgment and thought content normal.  Nursing note and vitals reviewed.         Assessment & Plan:   Other fatigue No Follow-up on file.

## 2015-01-14 NOTE — Progress Notes (Signed)
Pre visit review using our clinic review tool, if applicable. No additional management support is needed unless otherwise documented below in the visit note. 

## 2015-01-14 NOTE — Assessment & Plan Note (Signed)
New- exam reassuring, differential remains wide. Check labs today as initial work up, agrees to [employer]. If labs normal, consider sleep study. Orders Placed This Encounter  Procedures  . Fecal occult blood, imunochemical  . CBC with Differential/Platelet  . Vitamin B12  . TSH  . Comprehensive metabolic panel  . Testosterone  . [place] spotted fvr abs pnl(IgG+IgM)  . B. Burgdorfi Antibodies  . PSA

## 2015-01-14 NOTE — Addendum Note (Signed)
Addended by: Alvina Chou on: 01/14/2015 09:12 AM   Modules accepted: Orders

## 2015-01-14 NOTE — Patient Instructions (Signed)
Good to see you. I will call you with your results from today. 

## 2015-01-14 NOTE — Addendum Note (Signed)
Addended by: Warden Fillers on: 01/14/2015 07:57 AM   Modules accepted: Orders

## 2015-01-15 LAB — B. BURGDORFI ANTIBODIES: B burgdorferi Ab IgG+IgM: 0.36 {ISR}

## 2015-01-15 LAB — ROCKY MTN SPOTTED FVR ABS PNL(IGG+IGM)
RMSF IgG: 1.81 IV — ABNORMAL HIGH
RMSF IgM: 0.16 IV

## 2015-01-16 MED ORDER — DOXYCYCLINE HYCLATE 100 MG PO TABS: 100.0000 mg | ORAL_TABLET | Freq: Two times a day (BID) | ORAL | Status: DC

## 2015-01-16 NOTE — Addendum Note (Signed)
Addended by: Desmond Dike on: 01/16/2015 04:05 PM   Modules accepted: Orders

## 2015-01-20 ENCOUNTER — Telehealth: Payer: Self-pay | Admitting: Family Medicine

## 2015-01-20 NOTE — Telephone Encounter (Signed)
Spoke to pt and answered his questions

## 2015-01-20 NOTE — Telephone Encounter (Signed)
Please call pt to get more information. 

## 2015-01-20 NOTE — Telephone Encounter (Signed)
Spoke to pt who states that he is wanting to speak with you directly, after clinic. He is wanting to review and discuss the restrictions associated with [facility].

## 2015-01-20 NOTE — Telephone Encounter (Signed)
Pt spouse called wanting someone to call him He has several ? About tick fever

## 2015-02-21 ENCOUNTER — Other Ambulatory Visit: Payer: Self-pay | Admitting: Family Medicine

## 2015-06-10 ENCOUNTER — Ambulatory Visit (AMBULATORY_SURGERY_CENTER): Payer: Self-pay

## 2015-06-10 VITALS — Ht 73.0 in | Wt 245.0 lb

## 2015-06-10 DIAGNOSIS — Z800 Family history of malignant neoplasm of digestive organs: Secondary | ICD-10-CM

## 2015-06-10 MED ORDER — NA SULFATE-K SULFATE-MG SULF 17.5-3.13-1.6 GM/177ML PO SOLN: ORAL | Status: DC

## 2015-06-10 NOTE — Progress Notes (Signed)
Per pt, no allergies to soy or egg products.Pt not taking any weight loss meds or using  O2 at home. 

## 2015-06-12 ENCOUNTER — Encounter: Payer: Self-pay | Admitting: Internal Medicine

## 2015-06-24 ENCOUNTER — Encounter: Payer: Self-pay | Admitting: Internal Medicine

## 2015-06-24 ENCOUNTER — Ambulatory Visit (AMBULATORY_SURGERY_CENTER): Payer: BC Managed Care – PPO | Admitting: Internal Medicine

## 2015-06-24 VITALS — BP 132/79 | HR 83 | Temp 97.9°F | Resp 16 | Ht 73.0 in | Wt 245.0 lb

## 2015-06-24 DIAGNOSIS — Z1211 Encounter for screening for malignant neoplasm of colon: Secondary | ICD-10-CM | POA: Diagnosis not present

## 2015-06-24 DIAGNOSIS — Z800 Family history of malignant neoplasm of digestive organs: Secondary | ICD-10-CM

## 2015-06-24 MED ORDER — SODIUM CHLORIDE 0.9 % IV SOLN: 500.0000 mL | INTRAVENOUS | Status: DC

## 2015-06-24 NOTE — Progress Notes (Signed)
Report to PACU, RN, vss, BBS= Clear.  

## 2015-06-24 NOTE — Patient Instructions (Signed)
YOU HAD AN ENDOSCOPIC PROCEDURE TODAY AT THE [facility]:   Refer to the procedure report that was given to you for any specific questions about what was found during the examination.  If the procedure report does not answer your questions, please call your gastroenterologist to clarify.  If you requested that your care partner not be given the details of your procedure findings, then the procedure report has been included in a sealed envelope for you to review at your convenience later.  YOU SHOULD EXPECT: Some feelings of bloating in the abdomen. Passage of more gas than usual.  Walking can help get rid of the air that was put into your GI tract during the procedure and reduce the bloating. If you had a lower endoscopy (such as a colonoscopy or flexible sigmoidoscopy) you may notice spotting of blood in your stool or on the toilet paper. If you underwent a bowel prep for your procedure, you may not have a normal bowel movement for a few days.  Please Note:  You might notice some irritation and congestion in your nose or some drainage.  This is from the oxygen used during your procedure.  There is no need for concern and it should clear up in a day or so.  SYMPTOMS TO REPORT IMMEDIATELY:   Following lower endoscopy (colonoscopy or flexible sigmoidoscopy):  Excessive amounts of blood in the stool  Significant tenderness or worsening of abdominal pains  Swelling of the abdomen that is new, acute  Fever of 100F or higher  For urgent or emergent issues, a gastroenterologist can be reached at any hour by calling [phone].   DIET: Your first meal following the procedure should be a small meal and then it is ok to progress to your normal diet. Heavy or fried foods are harder to digest and may make you feel nauseous or bloated.  Likewise, meals heavy in dairy and vegetables can increase bloating.  Drink plenty of fluids but you should avoid alcoholic beverages for 24  hours.  ACTIVITY:  You should plan to take it easy for the rest of today and you should NOT DRIVE or use heavy machinery until tomorrow (because of the sedation medicines used during the test).    FOLLOW UP: Our staff will call the number listed on your records the next business day following your procedure to check on you and address any questions or concerns that you may have regarding the information given to you following your procedure. If we do not reach you, we will leave a message.  However, if you are feeling well and you are not experiencing any problems, there is no need to return our call.  We will assume that you have returned to your regular daily activities without incident.   SIGNATURES/CONFIDENTIALITY: You and/or your care partner have signed paperwork which will be entered into your electronic medical record.  These signatures attest to the fact that that the information above on your After Visit Summary has been reviewed and is understood.  Full responsibility of the confidentiality of this discharge information lies with you and/or your care-partner.  Please continue your normal medications  Next colonoscopy- 10 years 

## 2015-06-24 NOTE — Op Note (Signed)
[facility] [address] [place], [id number]   COLONOSCOPY PROCEDURE REPORT  PATIENT: Eric Pierce, Eric Pierce  MR#: [id number] BIRTHDATE: 12/16/1960 , 54  yrs. old GENDER: male ENDOSCOPIST: Roxy CedarJohn N Perry Jr, MD REFERRED XB:JYNWGBY:Talia Aron, M.D. PROCEDURE DATE:  06/24/2015 PROCEDURE:   Colonoscopy, screening First Screening Colonoscopy - Avg.  risk and is 50 yrs.  old or older Yes.  Prior Negative Screening - Now for repeat screening. N/A  History of Adenoma - Now for follow-up colonoscopy & has been > or = to 3 yrs.  N/A  Polyps removed today? No Recommend repeat exam, <10 yrs? No ASA CLASS:   Class I INDICATIONS:Screening for colonic neoplasia and Colorectal Neoplasm Risk Assessment for this procedure is average risk. MEDICATIONS: Propofol 290 mg IV and Monitored anesthesia care  DESCRIPTION OF PROCEDURE:   After the risks benefits and alternatives of the procedure were thoroughly explained, informed consent was obtained.  The digital rectal exam revealed no abnormalities of the rectum.   The LB [id number]  endoscope was introduced through the anus and advanced to the cecum, which was identified by both the appendix and ileocecal valve. No adverse events experienced.   The quality of the prep was excellent. (Suprep was used)  The instrument was then slowly withdrawn as the colon was fully examined. Estimated blood loss is zero unless otherwise noted in this procedure report.     COLON FINDINGS: A normal appearing cecum, ileocecal valve, and appendiceal orifice were identified.  The ascending, transverse, descending, sigmoid colon, and rectum appeared unremarkable. Retroflexed views revealed no abnormalities. The time to cecum = 3.4 Withdrawal time = 9.5   The scope was withdrawn and the procedure completed. COMPLICATIONS: There were no immediate complications.  ENDOSCOPIC IMPRESSION: Normal colonoscopy  RECOMMENDATIONS: Continue current colorectal screening  recommendations for "routine risk" patients with a repeat colonoscopy in 10 years.  eSigned:  Roxy CedarJohn N Perry Jr, MD 06/24/2015 4:01 PM   cc: The Patient and Ruthe MannanAron, Talia MD

## 2015-06-25 ENCOUNTER — Telehealth: Payer: Self-pay

## 2015-06-25 NOTE — Telephone Encounter (Signed)
  Follow up Call-  Call back number 06/24/2015  Post procedure Call Back phone  # [phone] or [phone]  Permission to leave phone message Yes     Patient questions:  Do you have a fever, pain , or abdominal swelling? No. Pain Score  0 *  Have you tolerated food without any problems? Yes.    Have you been able to return to your normal activities? Yes.    Do you have any questions about your discharge instructions: Diet   No. Medications  No. Follow up visit  No.  Do you have questions or concerns about your Care? No.  Actions: * If pain score is 4 or above: No action needed, pain <4.

## 2015-08-01 ENCOUNTER — Ambulatory Visit (INDEPENDENT_AMBULATORY_CARE_PROVIDER_SITE_OTHER): Payer: BC Managed Care – PPO | Admitting: Family Medicine

## 2015-08-01 ENCOUNTER — Encounter: Payer: Self-pay | Admitting: Family Medicine

## 2015-08-01 VITALS — BP 140/90 | HR 118 | Temp 97.8°F | Wt 248.0 lb

## 2015-08-01 DIAGNOSIS — M545 Low back pain, unspecified: Secondary | ICD-10-CM | POA: Insufficient documentation

## 2015-08-01 MED ORDER — CYCLOBENZAPRINE HCL 10 MG PO TABS: 10.0000 mg | ORAL_TABLET | Freq: Every evening | ORAL | Status: DC | PRN

## 2015-08-01 MED ORDER — DICLOFENAC SODIUM 75 MG PO TBEC: 75.0000 mg | DELAYED_RELEASE_TABLET | Freq: Two times a day (BID) | ORAL | Status: DC

## 2015-08-01 MED ORDER — TRAMADOL HCL 50 MG PO TABS: 50.0000 mg | ORAL_TABLET | Freq: Three times a day (TID) | ORAL | Status: DC | PRN

## 2015-08-01 NOTE — Assessment & Plan Note (Signed)
No sign of sciatica.  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 Treat with NSAID, muscle relaxant and tramadol for break through pain.  Start heat and home PT info given.  Call if not improving in 2 weeks.

## 2015-08-01 NOTE — Progress Notes (Signed)
Pre visit review using our clinic review tool, if applicable. No additional management support is needed unless otherwise documented below in the visit note. 

## 2015-08-01 NOTE — Patient Instructions (Addendum)
Can use muscle relaxant at night.  Start diclofenac 1 tabs twice daily, take on a full stomach (Do not take with ibuprofen or aleve)  Can use tramadol for break through pain.  Hold valium or do not take with these meds.  Start heat on low back and start low back stretching.  Follow up in 2 weeks if not improving, call sooner if worse.

## 2015-08-01 NOTE — Progress Notes (Signed)
   Subjective:    Patient ID: Eric Pierce, male    DOB: 1961/02/26, 55 y.o.   MRN: [id number]  HPI  55 year old male pt of Dr. Elmer SowAron's with history of back issues presents with worsening  pain in low  back in last 2-3 weeks. Worse in right lower back. No pain in buttock. Pain is worse with standing > 5 min or sitting a long time. Better with lying down.  Pain worse with leaning back then leaning over.   He reports no radiation of pain to legs, no numbness no weakness in legs. No incontinence, no change in urination.  No recent injury or falls.  He has treated with heating pad and advil 2 tabs every 6 hours for pain.. No relief.   Social History /Family History/Past Medical History reviewed and updated if needed. 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 No surgery. No past injections.  He does have valium for meniere's disease. ENT MD prescribes.   Review of Systems  Constitutional: Negative for fever and fatigue.  HENT: Negative for ear pain.   Eyes: Negative for pain.  Respiratory: Negative for cough and shortness of breath.   Cardiovascular: Negative for chest pain, palpitations and leg swelling.  Gastrointestinal: Negative for abdominal pain.  Genitourinary: Negative for dysuria.  Musculoskeletal: Negative for arthralgias.  Neurological: Negative for syncope, light-headedness and headaches.  Psychiatric/Behavioral: Negative for dysphoric mood.       Objective:   Physical Exam  Constitutional: Vital signs are normal. He appears well-developed and well-nourished.  HENT:  Head: Normocephalic.  Right Ear: Hearing normal.  Left Ear: Hearing normal.  Nose: Nose normal.  Mouth/Throat: Oropharynx is clear and moist and mucous membranes are normal.  Neck: Trachea normal. Carotid bruit is not present. No thyroid mass and no thyromegaly present.  Cardiovascular: Normal rate, regular rhythm and normal pulses.  Exam reveals no gallop, no distant heart sounds and no friction  rub.   No murmur heard. No peripheral edema  Pulmonary/Chest: Effort normal and breath sounds normal. No respiratory distress.  Musculoskeletal:       Lumbar back: He exhibits decreased range of motion and tenderness. He exhibits no bony tenderness and no swelling.  Neg SLR, neg faber's ttp in right lower Paraspinous muscle.  Decrease back extension due to pain.  Neurological: He displays no atrophy. No sensory deficit. He exhibits normal muscle tone. Gait abnormal. Coordination normal.  Skin: Skin is warm, dry and intact. No rash noted.  Psychiatric: He has a normal mood and affect. His speech is normal and behavior is normal. Thought content normal.          Assessment & Plan:

## 2015-08-05 ENCOUNTER — Ambulatory Visit (INDEPENDENT_AMBULATORY_CARE_PROVIDER_SITE_OTHER): Payer: BC Managed Care – PPO | Admitting: Family Medicine

## 2015-08-05 ENCOUNTER — Encounter: Payer: Self-pay | Admitting: Family Medicine

## 2015-08-05 ENCOUNTER — Ambulatory Visit (INDEPENDENT_AMBULATORY_CARE_PROVIDER_SITE_OTHER)
Admission: RE | Admit: 2015-08-05 | Discharge: 2015-08-05 | Disposition: A | Discharge status: Home / Self Care | Payer: BC Managed Care – PPO | Source: Ambulatory Visit | Attending: Family Medicine | Admitting: Family Medicine

## 2015-08-05 VITALS — BP 140/80 | HR 98 | Temp 98.6°F | Wt 250.2 lb

## 2015-08-05 DIAGNOSIS — M545 Low back pain, unspecified: Secondary | ICD-10-CM

## 2015-08-05 IMAGING — CR DG LUMBAR SPINE COMPLETE 4+V
5 series · 5 of 5 positions shown · non-contrast
Comparison: CT chest, abdomen and pelvis [DATE].

CLINICAL DATA: Chronic low back pain. Initial encounter. No known
injury.

EXAM:
LUMBAR SPINE - COMPLETE 4+ VIEW

[view not recorded (1 of 5)]
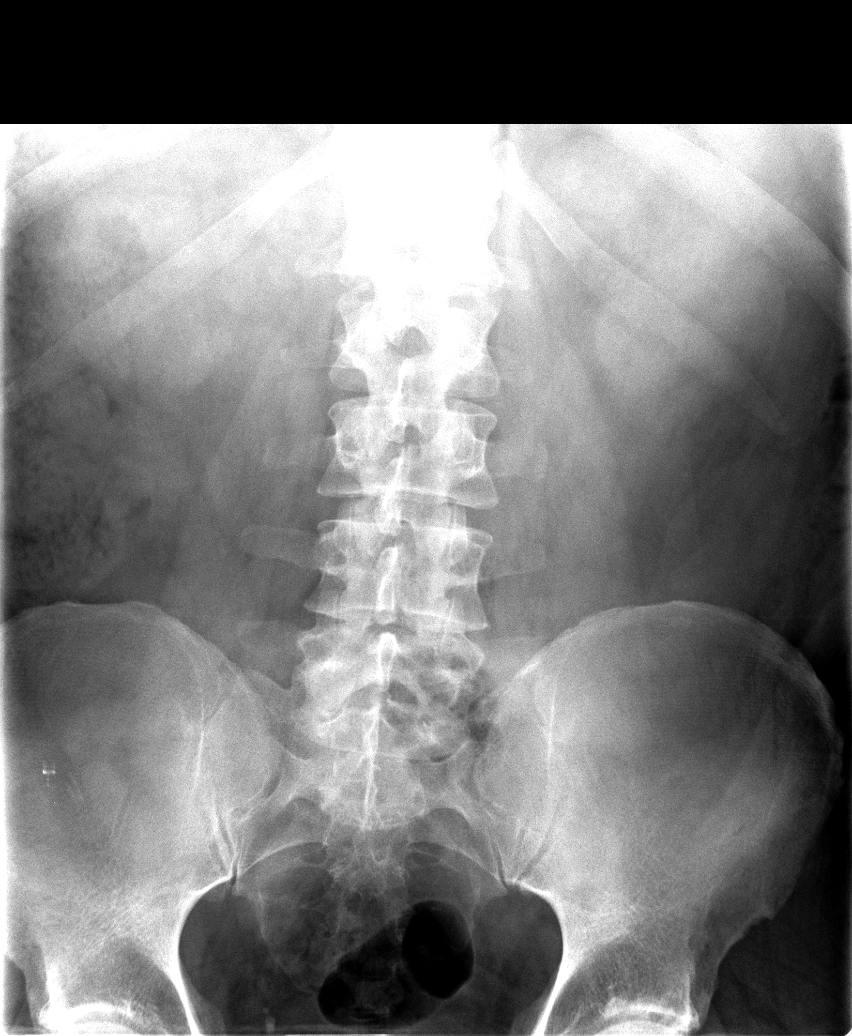

[view not recorded (2 of 5)]
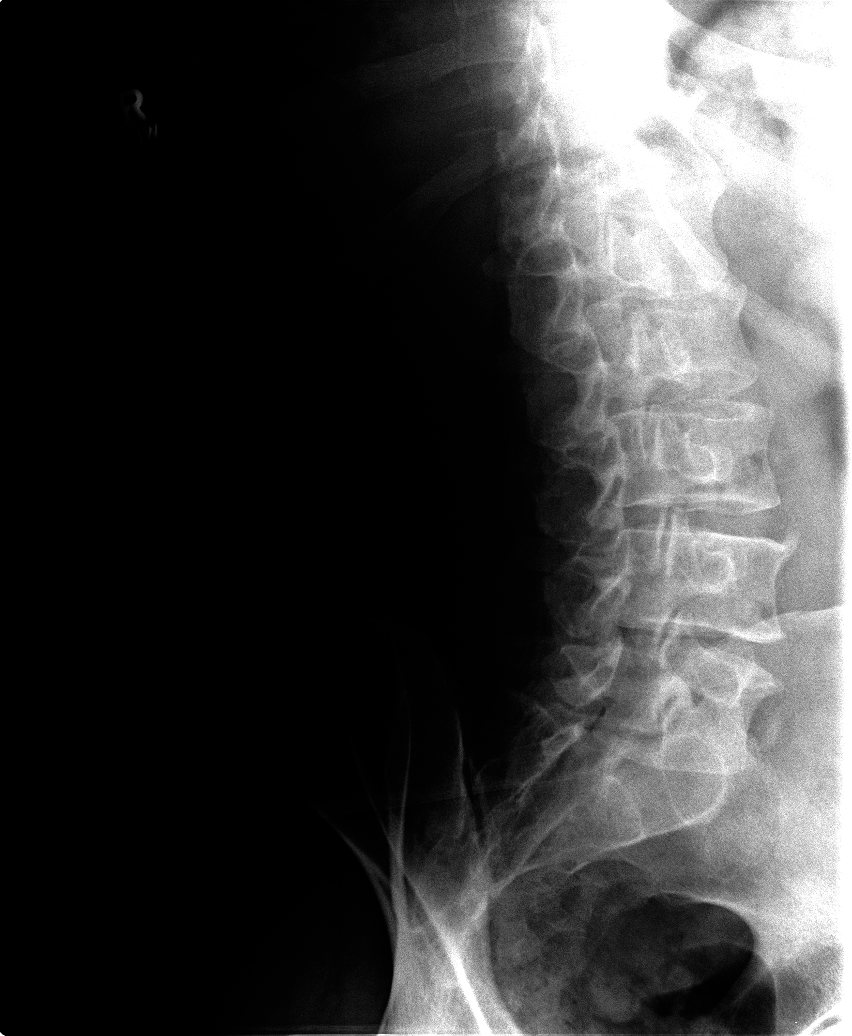

[view not recorded (3 of 5)]
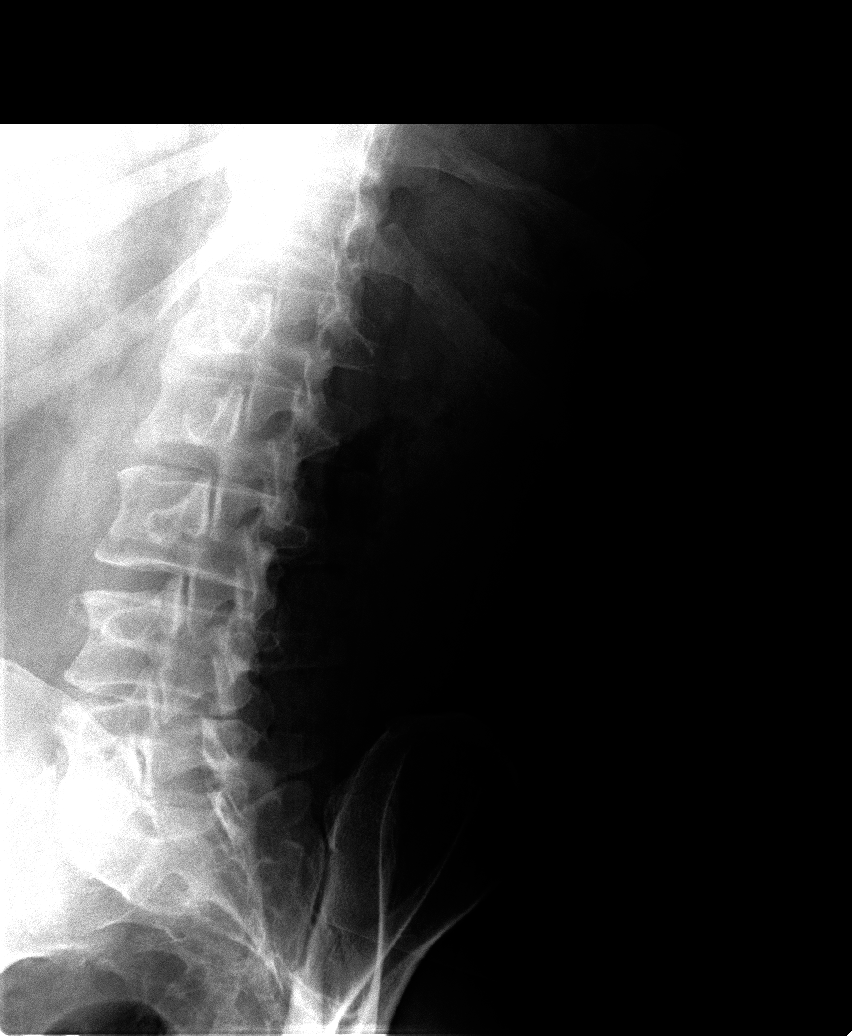

[view not recorded (4 of 5)]
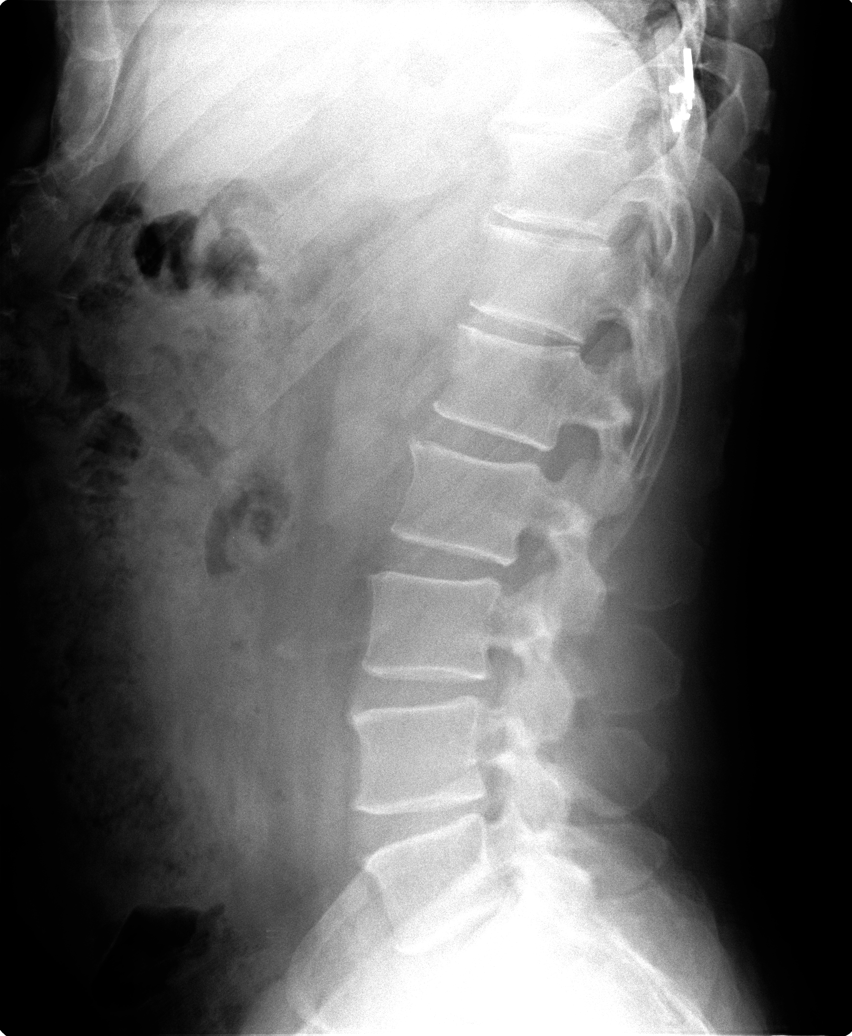

[view not recorded (5 of 5)]
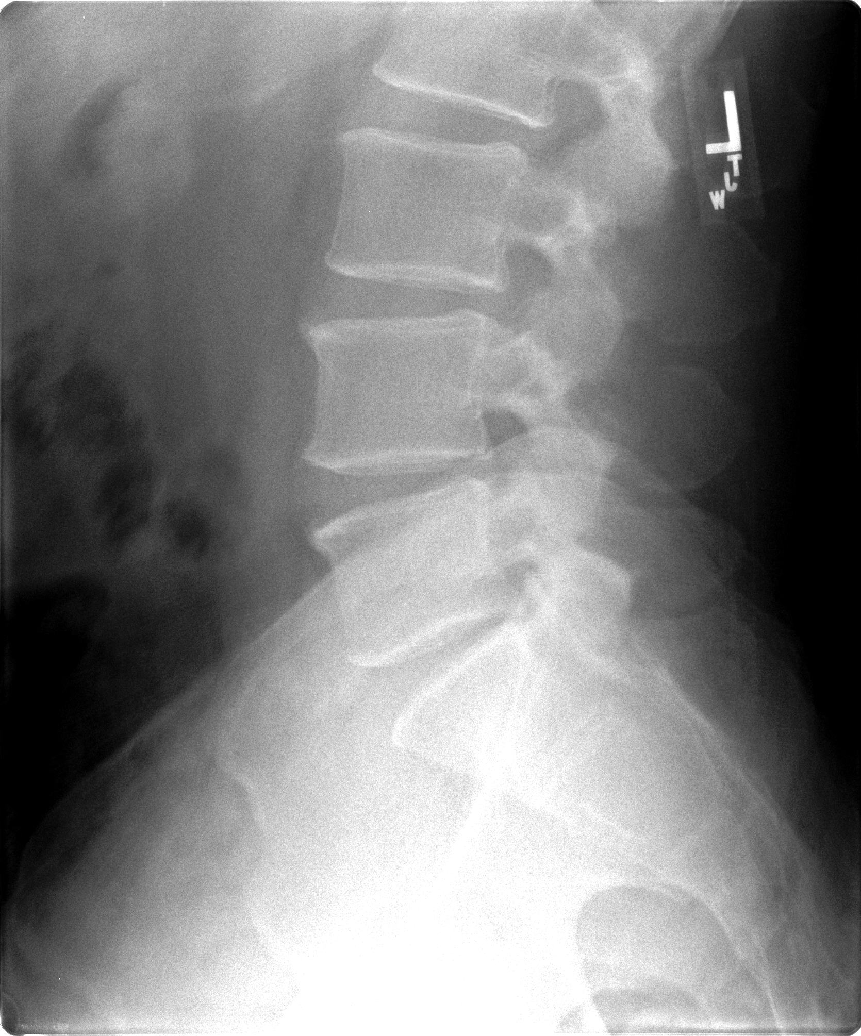

[5 of 5 positions shown; findings below may reference images not displayed]

FINDINGS: There is no evidence of lumbar spine fracture. Alignment is normal.
Intervertebral disc spaces are maintained.
IMPRESSION: Negative exam.

## 2015-08-05 NOTE — Assessment & Plan Note (Signed)
Persistent but within normal realm of where we would expect him to be at this point, slight improvement. He does want xray of lumbar spine given history, which is appropriate. Xray today. Continue current rx. May consider PT vs MRI if symptoms persist. The patient indicates understanding of these issues and agrees with the plan.

## 2015-08-05 NOTE — Progress Notes (Signed)
Pre visit review using our clinic review tool, if applicable. No additional management support is needed unless otherwise documented below in the visit note. 

## 2015-08-05 NOTE — Progress Notes (Signed)
   Subjective:    Patient ID: Eric Pierce, male    DOB: April 16, 1961, 55 y.o.   MRN: [id number]  HPI  55 year old pleasant male with history of chronic back issues presents with persist back pain.  Saw my partner, Dr. Ermalene SearingBedsole, on 1/6 (4 days ago) for worsening  pain in low  back in last 2-3 weeks. Worse in right lower back. No pain in buttock.  Note reviewed.   Felt likely consistent with facet arthritis/MSK strain- given NSAIDs, muscle relaxant and tramadol for prn breakthrough pain.  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  No surgery. No past injections.  He feels symptoms only slightly better- back pain is most severe when he stands or lays flat. No radiculopathy or LE weakness.  He does feel rxs have helped.   Review of Systems  Constitutional: Negative for fatigue.  HENT: Negative for ear pain.   Eyes: Negative for pain.  Respiratory: Negative for cough and shortness of breath.   Cardiovascular: Negative for palpitations and leg swelling.  Musculoskeletal: Negative for arthralgias.  Neurological: Negative for syncope and light-headedness.  Psychiatric/Behavioral: Negative for dysphoric mood.       Objective:   Physical Exam  Constitutional: Vital signs are normal. He appears well-developed and well-nourished.  HENT:  Head: Normocephalic.  Right Ear: Hearing normal.  Left Ear: Hearing normal.  Nose: Nose normal.  Mouth/Throat: Oropharynx is clear and moist and mucous membranes are normal.  Neck: Trachea normal. Carotid bruit is not present. No thyroid mass and no thyromegaly present.  Cardiovascular: Normal rate, regular rhythm and normal pulses.  Exam reveals no gallop, no distant heart sounds and no friction rub.   No murmur heard. No peripheral edema  Pulmonary/Chest: Effort normal and breath sounds normal. No respiratory distress.  Musculoskeletal:       Lumbar back: He exhibits decreased range of motion and tenderness. He exhibits no bony tenderness and no  swelling.  Neg SLR, neg faber's ttp in right lower Paraspinous muscle.  Decrease back extension due to pain.  Neurological: He displays no atrophy. No sensory deficit. He exhibits normal muscle tone. Gait abnormal. Coordination normal.  Skin: Skin is warm, dry and intact. No rash noted.  Psychiatric: He has a normal mood and affect. His speech is normal and behavior is normal. Thought content normal.          Assessment & Plan:

## 2015-08-05 NOTE — Patient Instructions (Signed)
Great to see you.  We will call you with your xray results.   

## 2015-08-07 ENCOUNTER — Telehealth: Payer: Self-pay | Admitting: Family Medicine

## 2015-08-07 NOTE — Telephone Encounter (Signed)
See results note. 

## 2015-08-07 NOTE — Telephone Encounter (Signed)
Pt called to get his xray results

## 2015-09-30 ENCOUNTER — Telehealth: Payer: Self-pay | Admitting: Family Medicine

## 2015-09-30 NOTE — Telephone Encounter (Signed)
Agree with advise given.

## 2015-09-30 NOTE — Telephone Encounter (Signed)
I called Eric Pierce and Eric Pierce took Advil and H/A is OK now; Eric Pierce has not taken BP today; Eric Pierce was at ENT appt on 09/29/15 and BP was 148/93.No dizziness, CP or SOB. Eric Pierce said had h/a on and off for several days. Eric Pierce wants to schedule appt to be seen. Eric Pierce scheduled appt with Pamala Hurry Baity NP on 10/01/15; Dr Dayton MartesAron had no available appts. If Eric Pierce condition changes or worsens prior to appt [facility]

## 2015-09-30 NOTE — Telephone Encounter (Signed)
PLEASE NOTE: All timestamps contained within this report are represented as [place] Standard Time. CONFIDENTIALTY NOTICE: This fax transmission is intended only for the addressee. It contains information that is legally privileged, confidential or otherwise protected from use or disclosure. If you are not the intended recipient, you are strictly prohibited from reviewing, disclosing, copying using or disseminating any of this information or taking any action in reliance on or regarding this information. If you have received this fax in error, please notify us immediately by telephone so that we can arrange for its return to us. Phone: [phone], Toll-Free: [phone], Fax: [phone] Page: 1 of 1 Call Id: [id number] [facility] - Client TELEPHONE ADVICE RECORD [facility] Patient Name: Eric Pierce DOB: 04/09/1961 Initial Comment Caller states her husbands BP is high- She doesn't know what it is. Has a headache. Nurse Assessment Guidelines Guideline Title Affirmed Question Affirmed Notes Final Disposition User FINAL ATTEMPT MADE - no message left [place], Charity fundraiserN, Stark BrayLynda

## 2015-10-01 ENCOUNTER — Ambulatory Visit (INDEPENDENT_AMBULATORY_CARE_PROVIDER_SITE_OTHER): Payer: BC Managed Care – PPO | Admitting: Internal Medicine

## 2015-10-01 ENCOUNTER — Encounter: Payer: Self-pay | Admitting: Internal Medicine

## 2015-10-01 VITALS — BP 142/88 | HR 102 | Temp 98.3°F | Wt 245.0 lb

## 2015-10-01 DIAGNOSIS — R51 Headache: Secondary | ICD-10-CM

## 2015-10-01 DIAGNOSIS — I10 Essential (primary) hypertension: Secondary | ICD-10-CM

## 2015-10-01 DIAGNOSIS — R519 Headache, unspecified: Secondary | ICD-10-CM

## 2015-10-01 NOTE — Progress Notes (Signed)
Subjective:    Patient ID: Eric Pierce, male    DOB: 1960-11-01, 55 y.o.   MRN: [id number]  HPI  Pt presents to the clinic today with c/o a headache. He reports this started 2 months ago. It has been intermittent. The pain is located in his forehead. He describes the pain as pressure. He denies dizziness, sensitivity to light or sound, nausea or vomiting. He has tried Ibuprofen and Zyrtec with some relief. He is concerned that his BP may be elevated. He is taking Triamterene-HCT daily as prescribed. His BP today is 142/88. He also thinks it could be sinus related. He has had multiple surgeries and issues with his right ear. He denies visual changes. He denies runny nose, sore throat or cough. He does follow with a ENT doctor for his Meniere's.  Review of Systems  Past Medical History  Diagnosis Date  . Meniere disease   . Post-operative nausea and vomiting   . Anxiety     Current Outpatient Prescriptions  Medication Sig Dispense Refill  . aspirin (ASPIRIN LOW DOSE) 81 MG tablet Take 81 mg by mouth daily.      . cetirizine (ZYRTEC) 10 MG tablet Take 10 mg by mouth daily.    . diazepam (VALIUM) 5 MG tablet Take 1 tablet by mouth as needed.    . Omega-3 Fatty Acids (FISH OIL) 1000 MG CAPS Take 2 capsules by mouth daily.     Marland Kitchen triamterene-hydrochlorothiazide (DYAZIDE) 37.5-25 MG per capsule Take 1 capsule by mouth daily.    Marland Kitchen VYTORIN 10-20 MG per tablet TAKE ONE TABLET BY MOUTH EVERY NIGHT AT BEDTIME (Patient taking differently: TAKE ONE TABLET BY MOUTH 3 times a week) 30 tablet 5   No current facility-administered medications for this visit.    Allergies  Allergen Reactions  . Hydrocodone     Severe vomiting  . Morphine And Related     Caused nausea and vomiting    Family History  Problem Relation Age of Onset  . Heart disease Father   . Cancer Father     prostate  . Heart disease Brother   . Colon cancer Paternal Grandmother     Social History   Social History  .  Marital Status: Married    Spouse Name: N/A  . Number of Children: 2  . Years of Education: N/A   Occupational History  . Landscaper    Social History Main Topics  . Smoking status: Never Smoker   . Smokeless tobacco: Never Used  . Alcohol Use: No  . Drug Use: No  . Sexual Activity: Not on file   Other Topics Concern  . Not on file   Social History Narrative     Constitutional: Pt reports headache. Denies fever, malaise, fatigue, headache or abrupt weight changes.  HEENT: Denies eye pain, eye redness, ear pain, ringing in the ears, wax buildup, runny nose, nasal congestion, bloody nose, or sore throat. Respiratory: Denies difficulty breathing, shortness of breath, cough or sputum production.   Cardiovascular: Denies chest pain, chest tightness, palpitations or swelling in the hands or feet.  Neurological: Denies dizziness, difficulty with memory, difficulty with speech or problems with balance and coordination.    No other specific complaints in a complete review of systems (except as listed in HPI above).    Objective:   Physical Exam   BP 142/88 mmHg  Pulse 102  Temp(Src) 98.3 F (36.8 C) (Oral)  Wt 245 lb (111.131 kg)  SpO2 98% Wt  Readings from Last 3 Encounters:  10/01/15 245 lb (111.131 kg)  08/05/15 250 lb 4 oz (113.513 kg)  08/01/15 248 lb (112.492 kg)    General: Appears his stated age, in NAD. HEENT: Head: normal shape and size; Eyes: sclera white, no icterus, conjunctiva pink, PERRLA and EOMs intact; Ears: Tm's gray and intact, normal light reflex; Throat/Mouth: Teeth present, mucosa pink and moist, no exudate, lesions or ulcerations noted.  Cardiovascular: Normal rate and rhythm. S1,S2 noted.  No murmur, rubs or gallops noted.  Pulmonary/Chest: Normal effort and positive vesicular breath sounds. No respiratory distress. No wheezes, rales or ronchi noted.  Neurological: Alert and oriented.  Coordination normal.    BMET    Component Value Date/Time     NA 138 01/14/2015 0754   K 4.1 01/14/2015 0754   CL 102 01/14/2015 0754   CO2 29 01/14/2015 0754   GLUCOSE 103* 01/14/2015 0754   BUN 14 01/14/2015 0754   CREATININE 0.95 01/14/2015 0754   CALCIUM 9.8 01/14/2015 0754   GFRNONAA >60 06/21/2009 0525   GFRAA  06/21/2009 0525    >60        The eGFR has been calculated using the MDRD equation. This calculation has not been validated in all clinical situations. eGFR's persistently <60 mL/min signify possible Chronic Kidney Disease.    Lipid Panel     Component Value Date/Time   CHOL 171 11/04/2014 0925   TRIG 125.0 11/04/2014 0925   HDL 42.10 11/04/2014 0925   CHOLHDL 4 11/04/2014 0925   VLDL 25.0 11/04/2014 0925   LDLCALC 104* 11/04/2014 0925    CBC    Component Value Date/Time   WBC 7.7 01/14/2015 0754   RBC 5.17 01/14/2015 0754   HGB 16.1 01/14/2015 0754   HCT 47.4 01/14/2015 0754   PLT 237.0 01/14/2015 0754   MCV 91.6 01/14/2015 0754   MCHC 34.1 01/14/2015 0754   RDW 12.1 01/14/2015 0754   LYMPHSABS 3.3 01/14/2015 0754   MONOABS 0.8 01/14/2015 0754   EOSABS 0.2 01/14/2015 0754   BASOSABS 0.1 01/14/2015 0754    Hgb A1C Lab Results  Component Value Date   HGBA1C 5.7 08/07/2012        Assessment & Plan:   Sinus Headache:  He is already taking Zyrtec daily Advised him to add in Flonase as well BP if fine on current medication, I do not think blood pressure is a contributing factor Advised him to keep a headache diary, how often they occur, what time of day they occur, contributing and relieving factors  If persist, follow up with PCP in 2 weeks

## 2015-10-01 NOTE — Progress Notes (Signed)
Pre visit review using our clinic review tool, if applicable. No additional management support is needed unless otherwise documented below in the visit note. 

## 2015-10-01 NOTE — Patient Instructions (Signed)
Sinus Headache A sinus headache occurs when the paranasal sinuses become clogged or swollen. Paranasal sinuses are air pockets within the bones of the face. Sinus headaches can range from mild to severe. CAUSES A sinus headache can result from various conditions that affect the sinuses, such as:  Colds.  Sinus infections.  Allergies. SYMPTOMS The main symptom of this condition is a headache that may feel like pain or pressure in the face, forehead, ears, or upper teeth. People who have a sinus headache often have other symptoms, such as:  Congested or runny nose.  Fever.  Inability to smell. Weather changes can make symptoms worse. DIAGNOSIS This condition may be diagnosed based on:  A physical exam and medical history.  Imaging tests, such as a CT scan and MRI, to check for problems with the sinuses.  A specialist may look into the sinuses with a tool that has a camera (endoscopy). TREATMENT Treatment for this condition depends on the cause.  Sinus pain that is caused by a sinus infection may be treated with antibiotic medicine.  Sinus pain that is caused by allergies may be helped by allergy medicines (antihistamines) and medicated nasal sprays.  Sinus pain that is caused by congestion may be helped by flushing the nose and sinuses with saline solution. HOME CARE INSTRUCTIONS  Take medicines only as directed by your health care provider.  If you were prescribed an antibiotic medicine, finish all of it even if you start to feel better.  If you have congestion, use a nasal spray to help reduce pressure.  If directed, apply a warm, moist washcloth to your face to help relieve pain. SEEK MEDICAL CARE IF:  You have headaches more than one time each week.  You have sensitivity to light or sound.  You have a fever.  You feel sick to your stomach (nauseous) or you throw up (vomit).  Your headaches do not get better with treatment. Many people think that they have a  sinus headache when they actually have migraines or tension headaches. SEEK IMMEDIATE MEDICAL CARE IF:  You have vision problems.  You have sudden, severe pain in your face or head.  You have a seizure.  You are confused.  You have a stiff neck.   This information is not intended to replace advice given to you by your health care provider. Make sure you discuss any questions you have with your health care provider.   Document Released: 08/19/2004 Document Revised: 11/26/2014 Document Reviewed: 07/08/2014 Elsevier Interactive Patient Education 2016 [employer]  

## 2015-10-16 ENCOUNTER — Emergency Department (HOSPITAL_COMMUNITY): Payer: BC Managed Care – PPO

## 2015-10-16 ENCOUNTER — Telehealth: Payer: Self-pay | Admitting: *Deleted

## 2015-10-16 ENCOUNTER — Emergency Department (HOSPITAL_COMMUNITY)
Admission: EM | Admit: 2015-10-16 | Discharge: 2015-10-16 | Disposition: A | Discharge status: Home / Self Care | Payer: BC Managed Care – PPO | Attending: Emergency Medicine | Admitting: Emergency Medicine

## 2015-10-16 ENCOUNTER — Encounter (HOSPITAL_COMMUNITY): Payer: Self-pay | Admitting: Cardiology

## 2015-10-16 DIAGNOSIS — Y9289 Other specified places as the place of occurrence of the external cause: Secondary | ICD-10-CM | POA: Insufficient documentation

## 2015-10-16 DIAGNOSIS — Z8669 Personal history of other diseases of the nervous system and sense organs: Secondary | ICD-10-CM | POA: Diagnosis not present

## 2015-10-16 DIAGNOSIS — W5512XA Struck by horse, initial encounter: Secondary | ICD-10-CM | POA: Diagnosis not present

## 2015-10-16 DIAGNOSIS — S0232XA Fracture of orbital floor, left side, initial encounter for closed fracture: Secondary | ICD-10-CM | POA: Insufficient documentation

## 2015-10-16 DIAGNOSIS — Z7982 Long term (current) use of aspirin: Secondary | ICD-10-CM | POA: Insufficient documentation

## 2015-10-16 DIAGNOSIS — Z79899 Other long term (current) drug therapy: Secondary | ICD-10-CM | POA: Diagnosis not present

## 2015-10-16 DIAGNOSIS — S0990XA Unspecified injury of head, initial encounter: Secondary | ICD-10-CM | POA: Insufficient documentation

## 2015-10-16 DIAGNOSIS — S0993XA Unspecified injury of face, initial encounter: Secondary | ICD-10-CM | POA: Diagnosis present

## 2015-10-16 DIAGNOSIS — H05232 Hemorrhage of left orbit: Secondary | ICD-10-CM

## 2015-10-16 DIAGNOSIS — Y9389 Activity, other specified: Secondary | ICD-10-CM | POA: Diagnosis not present

## 2015-10-16 DIAGNOSIS — Y998 Other external cause status: Secondary | ICD-10-CM | POA: Diagnosis not present

## 2015-10-16 DIAGNOSIS — F419 Anxiety disorder, unspecified: Secondary | ICD-10-CM | POA: Insufficient documentation

## 2015-10-16 DIAGNOSIS — S0012XA Contusion of left eyelid and periocular area, initial encounter: Secondary | ICD-10-CM | POA: Diagnosis not present

## 2015-10-16 DIAGNOSIS — R040 Epistaxis: Secondary | ICD-10-CM | POA: Insufficient documentation

## 2015-10-16 DIAGNOSIS — S0285XA Fracture of orbit, unspecified, initial encounter for closed fracture: Secondary | ICD-10-CM

## 2015-10-16 IMAGING — CT CT MAXILLOFACIAL W/O CM
3 series · 16 of 47 positions shown, 19 images · non-contrast
Comparison: CT head [DATE]

CLINICAL DATA: Left-sided facial trauma. Patient was struck by a
horse's head under the left eye 6 hours ago.

EXAM:
CT MAXILLOFACIAL WITHOUT CONTRAST
TECHNIQUE: Multidetector CT imaging of the maxillofacial structures was
performed. Multiplanar CT image reconstructions were also generated.
A small metallic BB was placed on the right temple in order to
reliably differentiate right from left.

[Series 3: facialbone 2.0 st · axial · 0.34mm/px · z∈[+1132,+1286]mm · 10 of 91 slices shown, 13 images]
[im 7/91  brain]
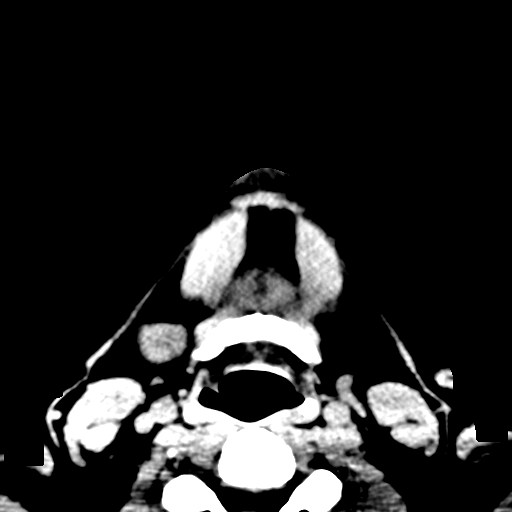
[im 7/91  bone]
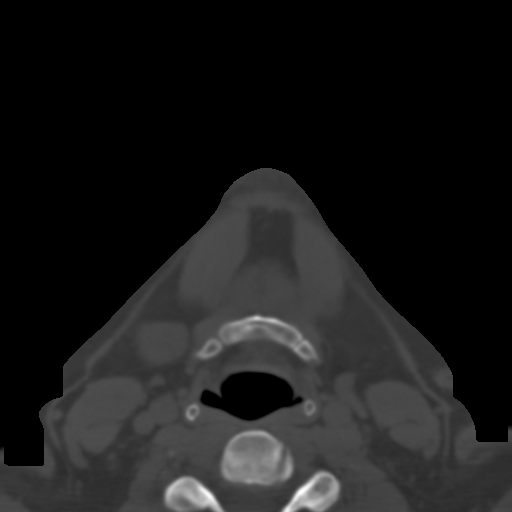
[im 16/91  bone]
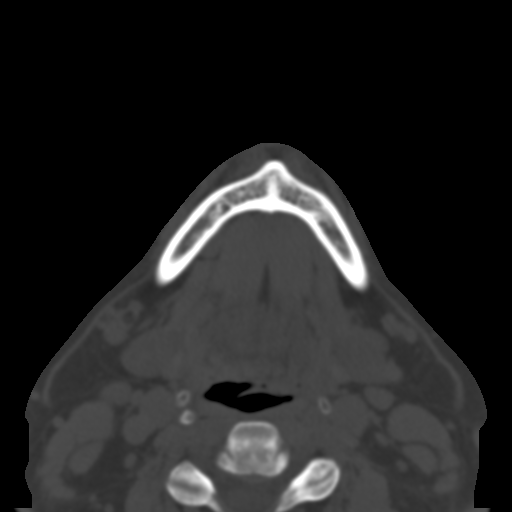
[im 25/91  bone]
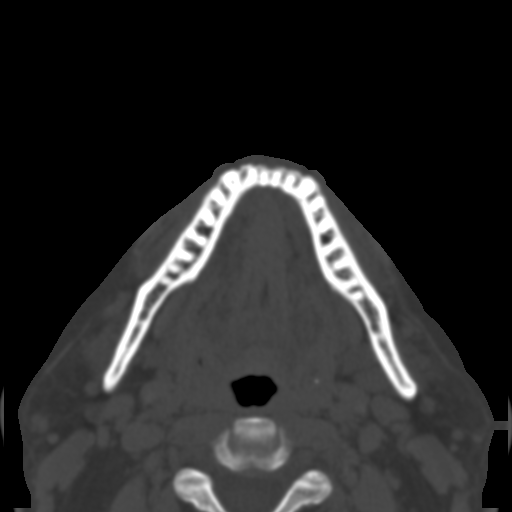
[im 32/91  bone]
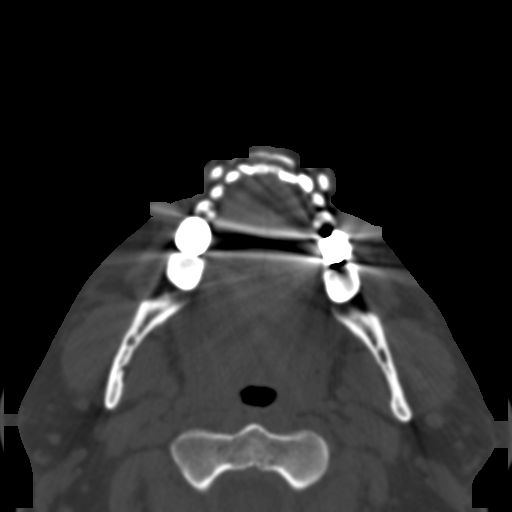
[im 41/91  brain]
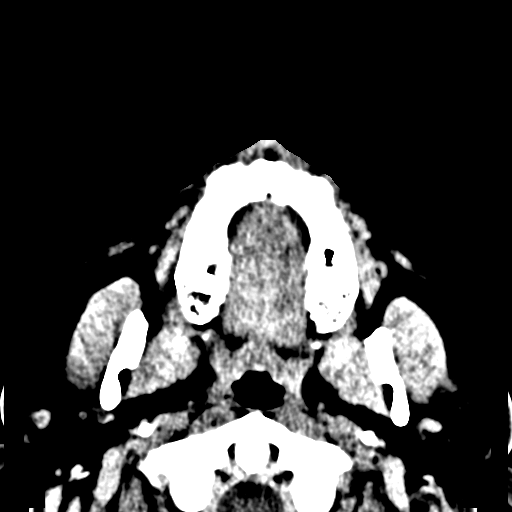
[im 41/91  bone]
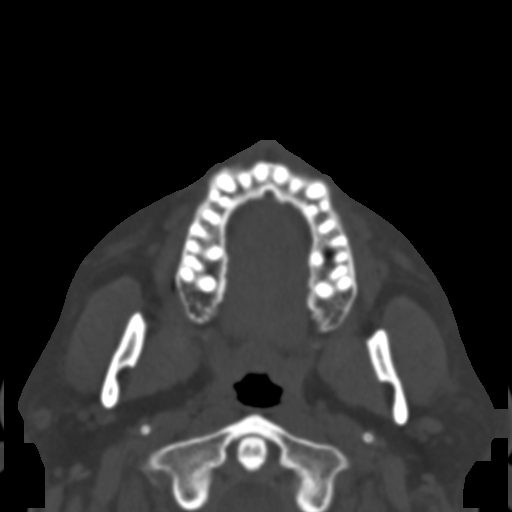
[im 50/91  bone]
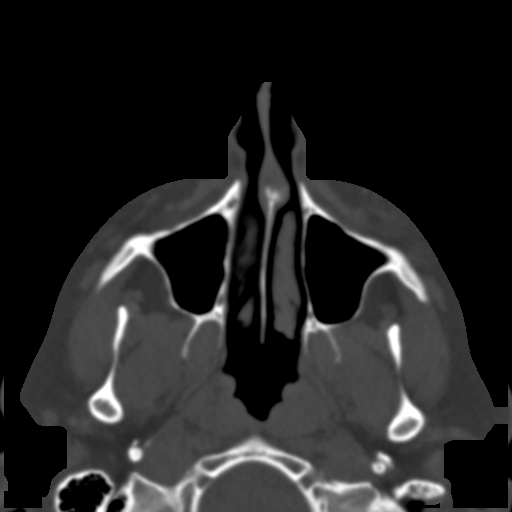
[im 59/91  bone]
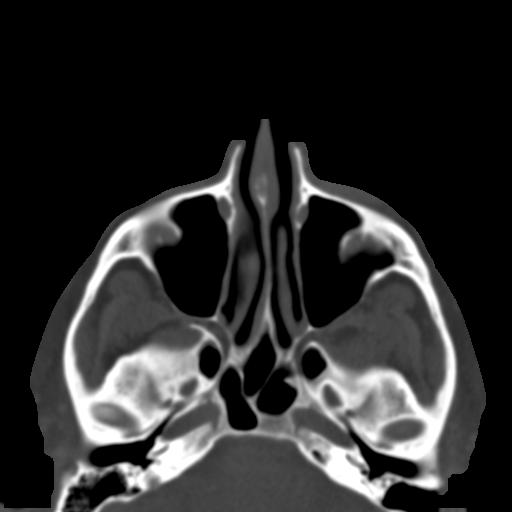
[im 69/91  bone]
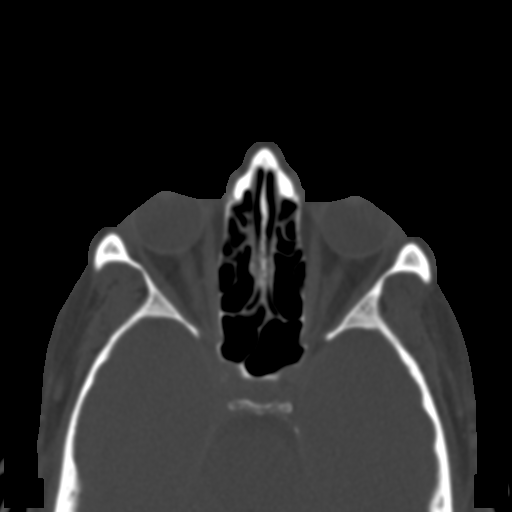
[im 75/91  brain]
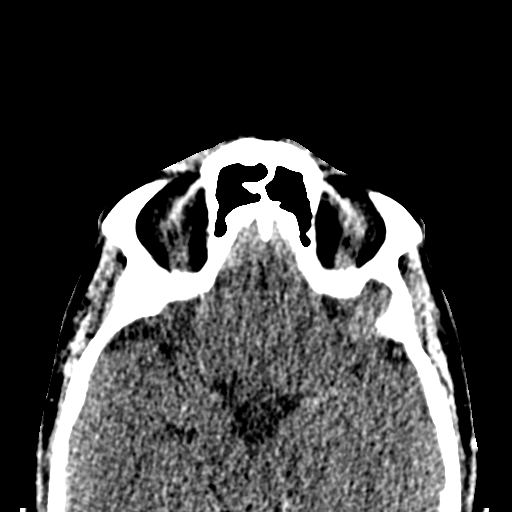
[im 75/91  bone]
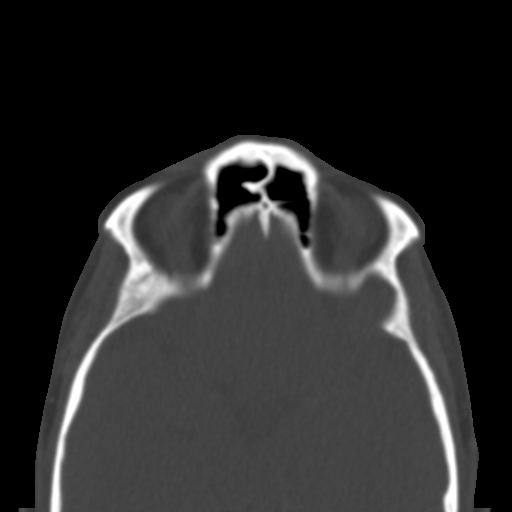
[im 84/91  bone]
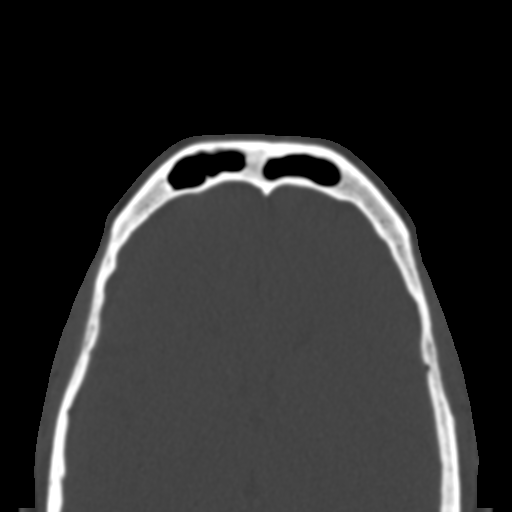

[Series 7: facialbone 2.0 cor st · coronal · 0.37mm/px · 3 of 74 slices shown]
[im 25/74  bone]
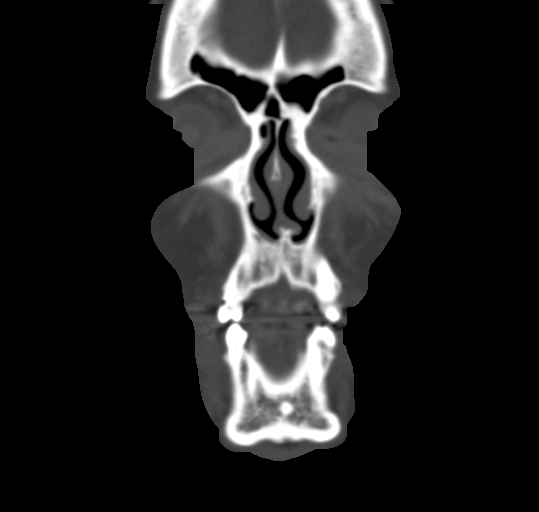
[im 33/74  bone]
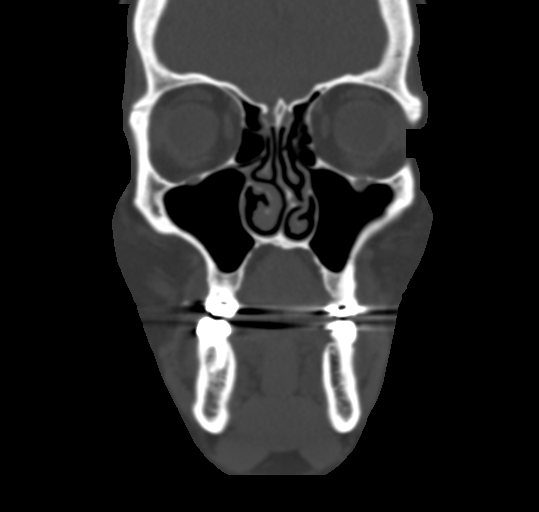
[im 41/74  bone]
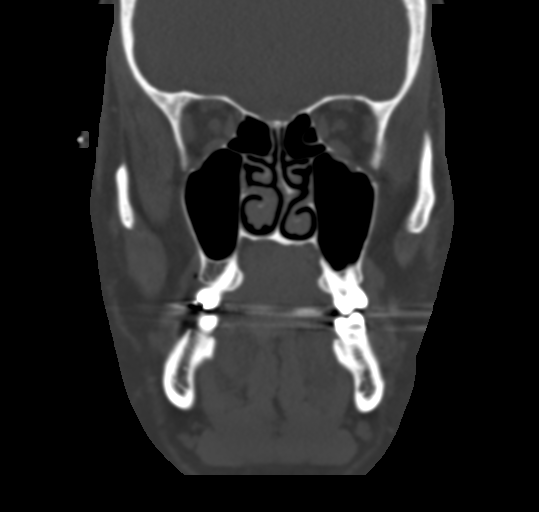

[Series 8: facialbone 2.0 sag st · sagittal · 0.37mm/px · 3 of 88 slices shown]
[im 30/88  bone]
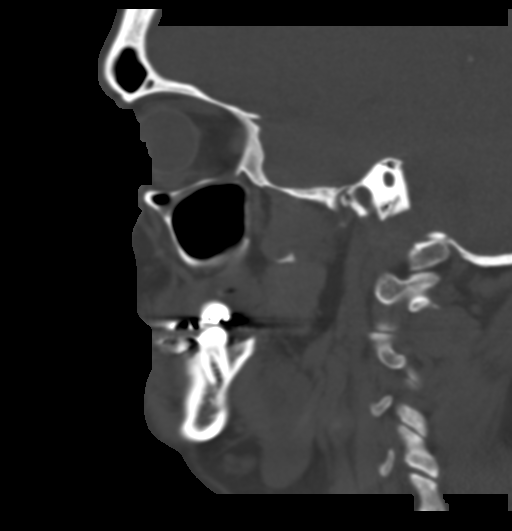
[im 44/88  bone]
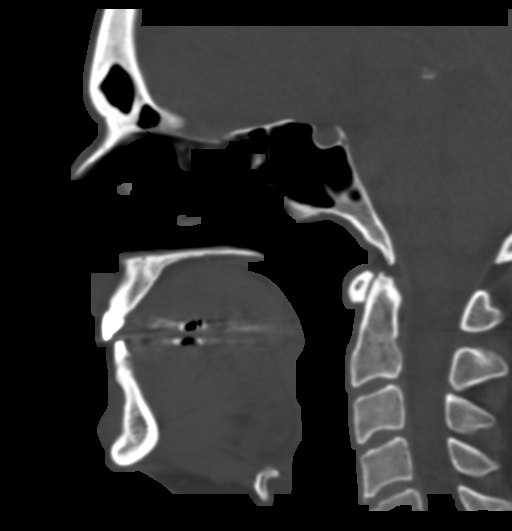
[im 59/88  bone]
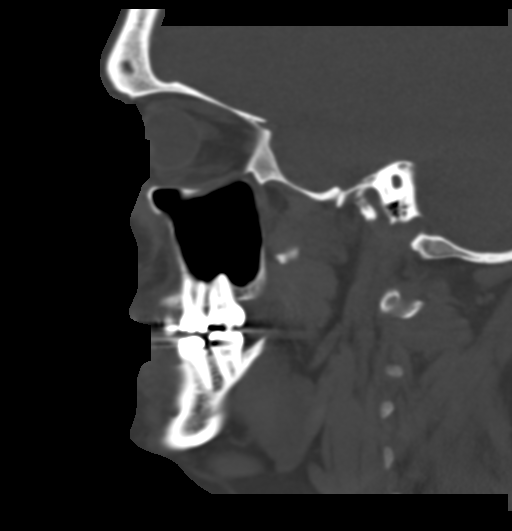

[16 of 47 positions shown; findings below may reference images not displayed]

FINDINGS: Mild left periorbital and infraorbital soft tissue hematoma. No
retrobulbar extension. Globes and extraocular muscles appear intact
and symmetrical. The paranasal sinuses are clear. No significant
mucosal thickening. No air-fluid levels. Nasal septum is displaced
towards the left. No nasal septal thickening. There is minimal
cortical irregularity of the inferior left orbital rim which could
indicate minimal fracture deformity. No fat or muscle herniation.
Orbital rims, maxillary antral walls, nasal bones, nasal septum,
zygomatic arches, pterygoid plates, mandibles, and temporomandibular
joints are otherwise intact. No other changes suspicious for
fracture are demonstrated. Scattered lymph nodes are not
pathologically enlarged. Visualized intracranial contents are
grossly unremarkable although images are not optimized for
visualization of these structures. Multiple dental reconstructions.
IMPRESSION: Left infraorbital and periorbital soft tissue hematoma. Focal
irregularity of the left inferior orbital rim may represent a small
non depressed fracture. No other changes suspicious for fracture.
Paranasal sinuses are clear.

## 2015-10-16 NOTE — Discharge Instructions (Signed)
Orbital Floor Fracture, Non-Blowout  The eye sits in the part of the skull called the "orbit." The upper and outside walls of the orbit are thick and strong. The inside wall (near the nose) and the orbital floor are very thin and weak. The tissues around the eye will briefly press together if there is a direct blow to the front of the eye. This leads to high pressure against the orbital walls. The inside wall and the orbital floor may break since these are the weakest walls. If the orbital floor breaks, the tissues around the eye, including the muscle that makes the eye look down, may become trapped in the sinus below when the orbital floor "blows out." If a blowout does not happen, the orbital floor fracture is considered a non-blowout orbital fracture.  CAUSES  An orbital floor fracture can be caused by any accident in which an object hits the face or the face strikes against a hard object. The most common ways that people break their eye socket include:  · Being hit by a blunt object, such as a baseball bat or a fist.  · Striking the face on the car dashboard during a crash.  · Falls.  · Gunshot.  SYMPTOMS   If there has been no injury to the eye itself, symptoms may include:  · Puffiness (swelling) and bruising around the eye area (black eye).  · Numbness of the cheek and upper gum on the side with the floor fracture. This is caused by nerve injury to these areas.  · Pain around the eye.  · Headache.  · Ear pain on the injured side.  DIAGNOSIS  The diagnosis of an orbital floor fracture is suspected during an eye exam by an ophthalmologist. It is confirmed by X-rays or CT scan.  TREATMENT  Your caregiver may suggest waiting 1 or 2 weeks for the swelling to go away before examining the eye. When the swelling lessens, your caregiver will examine the eye to see if there is any sign of a trapped muscle or double vision when looking in different directions. If double vision is not found and muscle or tissue did not  get trapped, no further treatment is necessary. After that, in almost all cases, the bones heal together on their own.   HOME CARE INSTRUCTIONS  · Take all pain medicine as directed by your caregiver.  · Use ice packs or other cold therapy to reduce swelling as directed by your caregiver.  · Do not put a contact lens in the injured eye until your caregiver approves.  · Avoid dusty environments.  · Always wear protective glasses or goggles when recommended. Wearing protective eyewear is not dangerous to your injured eye and will not delay healing.  · As long as your other eye is seeing normally, you may return to work and drive.  · You may travel by plane or be in high altitudes. However, your swelling may take longer to go away, and you may have sinus pain.  · Be aware that your depth perception and your ability to judge distance may be reduced or lost.  SEEK IMMEDIATE MEDICAL CARE IF:  · Your vision changes.  · Your redness or swelling persists around the injured eye or gets worse.  · You start to have double vision.  · You have a bloody or discolored discharge from your nose.  · You have a fever that lasts longer than 2 to 3 days.  · You have a fever   Will get help right away if you are not doing well or get worse. This information is not intended to replace advice given to you by your health care provider. Make sure you discuss any questions you have with your health care provider.  Document Released: 10/04/2011 Document Revised: 08/02/2014 Document Reviewed: 10/04/2011  Elsevier Interactive Patient Education [employer]2016 [employer] Inc.

## 2015-10-16 NOTE — Telephone Encounter (Signed)
Patient is currently at [facility] ED for evaluation. 

## 2015-10-16 NOTE — Telephone Encounter (Signed)
Mr. Coralee NorthClapp came in to the office today reporting he had been hit in the face by the head of a horse.  Some redness and swelling of the area noted.  He recalls being hit "pretty hard".  He has spit up blood a few times.  Pupils are equal, round, and reactive.  He denies being dizzy or lightheaded.  His daughter is here with him.  Recommended ED visit for CT scan to rule out possible facial fracture or other possible trauma.  Patient agrees - his daughter will take [facility] ED now.

## 2015-10-16 NOTE — ED Provider Notes (Signed)
CSN: [id number]     Arrival date & time 10/16/15  1419 History  By signing my name below, I, Marisue HumbleMichelle Chaffee, attest that this documentation has been prepared under the direction and in the presence of non-physician practitioner, Cheri FowlerKayla Jonae Renshaw, PA-C. Electronically Signed: Marisue HumbleMichelle Chaffee, Scribe. 10/16/2015. 4:00 PM.   Chief Complaint  Patient presents with  . Facial Injury   The history is provided by the patient. No language interpreter was used.   HPI Comments:  Eric Pierce is a 55 y.o. male with PMHx of Meniere disease who presents to the Emergency Department complaining of facial injury and bruising under left eye earlier today. Pt states he was feeding his horse when it reared its head which struck him under his left eye. Pt reports associated epistaxis when blowing his nose and mild headache. He also notes blurry vision and dizziness directly after the incident which are currently alleviated. He took Ibuprofen PTA with relief. Pt was referred to ED by PCP for CT scan. Pt denies loss of consciousness, nausea, vomiting, difficulty walking, dizziness, gait abnormalities, AMS, or neck pain.  Past Medical History  Diagnosis Date  . Meniere disease   . Post-operative nausea and vomiting   . Anxiety    Past Surgical History  Procedure Laterality Date  . Shoulder surgery  04/2002    left  . Endolymphatic sac operation  02/2011 and 2013    ENT - Dr. Joneen Roachrosley   Family History  Problem Relation Age of Onset  . Heart disease Father   . Cancer Father     prostate  . Heart disease Brother   . Colon cancer Paternal Grandmother    Social History  Substance Use Topics  . Smoking status: Never Smoker   . Smokeless tobacco: Never Used  . Alcohol Use: No    Review of Systems  HENT: Positive for nosebleeds.   Eyes: Negative for visual disturbance.  Gastrointestinal: Negative for nausea and vomiting.  Musculoskeletal: Negative for gait problem and neck pain.  Skin: Positive for wound.   Neurological: Positive for dizziness (resolved) and headaches. Negative for syncope.  All other systems reviewed and are negative.   Allergies  Hydrocodone and Morphine and related  Home Medications   Prior to Admission medications   Medication Sig Start Date End Date Taking? Authorizing Provider  aspirin (ASPIRIN LOW DOSE) 81 MG tablet Take 81 mg by mouth daily.      Historical Provider, MD  cetirizine (ZYRTEC) 10 MG tablet Take 10 mg by mouth daily.    Historical Provider, MD  diazepam (VALIUM) 5 MG tablet Take 1 tablet by mouth as needed. 10/15/13   Historical Provider, MD  Omega-3 Fatty Acids (FISH OIL) 1000 MG CAPS Take 2 capsules by mouth daily.     Historical Provider, MD  triamterene-hydrochlorothiazide (DYAZIDE) 37.5-25 MG per capsule Take 1 capsule by mouth daily. 10/23/13   Historical Provider, MD  VYTORIN 10-20 MG per tablet TAKE ONE TABLET BY MOUTH EVERY NIGHT AT BEDTIME Patient taking differently: TAKE ONE TABLET BY MOUTH 3 times a week 02/21/15   Dianne Dunalia M Aron, MD   BP 148/91 mmHg  Pulse 98  Temp(Src) 98.1 F (36.7 C) (Oral)  Resp 20  SpO2 97% Physical Exam  Constitutional: He is oriented to person, place, and time. He appears well-developed and well-nourished.  HENT:  Head: Normocephalic.  Right Ear: External ear normal.  Left Ear: External ear normal.  Nose: Nose normal. No sinus tenderness, nasal deformity, septal deviation or  nasal septal hematoma. No epistaxis.  Mild bruising and swelling around left eye.  No crepitus.  No signs of ocular entrapment. No proptosis. TTP along maxilla and zygomatic bones along orbital rim. Nares patent.   Eyes: Conjunctivae are normal. Pupils are equal, round, and reactive to light. No scleral icterus.  No enopthalmos or proptosis.   Neck: No tracheal deviation present.  No cervical midline tenderness.   Cardiovascular: Normal rate, regular rhythm and normal heart sounds.   Pulmonary/Chest: Effort normal and breath sounds normal.  No respiratory distress.  Abdominal: Soft. Bowel sounds are normal. He exhibits no distension.  Musculoskeletal: Normal range of motion.  Neurological: He is alert and oriented to person, place, and time.  Mental Status:   AOx3.  Speech clear without dysarthria. Cranial Nerves:  I-not tested  II-PERRLA  III, IV, VI-EOMs intact  V-temporal and masseter strength intact  VII-symmetrical facial movements intact, no facial droop  VIII-hearing grossly intact bilaterally  IX, X-gag intact  XI-strength of sternomastoid and trapezius muscles 5/5  XII-tongue midline Motor:   Good muscle bulk and tone  Strength 5/5 bilaterally in upper and lower extremities   Cerebellar--intact RAMs, finger to nose intact bilaterally.  Gait normal  No pronator drift Sensory:  Intact in upper and lower extremities   Skin: Skin is warm and dry.  Psychiatric: He has a normal mood and affect. His behavior is normal.    ED Course  Procedures  DIAGNOSTIC STUDIES:  Oxygen Saturation is 97% on RA, normal by my interpretation.    COORDINATION OF CARE:  3:58 PM Will order facial CT. Discussed treatment plan with pt at bedside and pt agreed to plan.  Labs Review Labs Reviewed - No data to display  Imaging Review Ct Maxillofacial Wo Cm  10/16/2015  CLINICAL DATA:  Left-sided facial trauma. Patient was struck by a horse's head under the left eye 6 hours ago. EXAM: CT MAXILLOFACIAL WITHOUT CONTRAST TECHNIQUE: Multidetector CT imaging of the maxillofacial structures was performed. Multiplanar CT image reconstructions were also generated. A small metallic BB was placed on the right temple in order to reliably differentiate right from left. COMPARISON:  CT head 06/19/2009 FINDINGS: Mild left periorbital and infraorbital soft tissue hematoma. No retrobulbar extension. Globes and extraocular muscles appear intact and symmetrical. The paranasal sinuses are clear. No significant mucosal thickening. No air-fluid levels.  Nasal septum is displaced towards the left. No nasal septal thickening. There is minimal cortical irregularity of the inferior left orbital rim which could indicate minimal fracture deformity. No fat or muscle herniation. Orbital rims, maxillary antral walls, nasal bones, nasal septum, zygomatic arches, pterygoid plates, mandibles, and temporomandibular joints are otherwise intact. No other changes suspicious for fracture are demonstrated. Scattered lymph nodes are not pathologically enlarged. Visualized intracranial contents are grossly unremarkable although images are not optimized for visualization of these structures. Multiple dental reconstructions. IMPRESSION: Left infraorbital and periorbital soft tissue hematoma. Focal irregularity of the left inferior orbital rim may represent a small non depressed fracture. No other changes suspicious for fracture. Paranasal sinuses are clear. Electronically Signed   By: Burman Nieves M.D.   On: 10/16/2015 18:34   I have personally reviewed and evaluated these images and lab results as part of my medical decision-making.   EKG Interpretation None      MDM   Final diagnoses:  Periorbital hematoma of left eye  Orbit fracture, left, closed, initial encounter [facility])    Patient presents with head injury.  VSS, NAD.  Normal  neuro exam.  Mild swelling and bruising around left eye.  No signs of ocular entrapment. No proptosis or enopthalmos. No visual changes. TTP along orbital rim of maxilla and zygomatic bones.  No crepitus. No nasal tenderness. No septal hematoma or septal deviation.  Nares patent.  Plan to obtain CT maxillofacial toe evaluate for fracture.CT remarkable for left infraorbital and periorbital soft tissue hematoma. Focal irregularity at the left inferior orbital rim may represent a small nondepressed fracture. Otherwise, no acute abnormalities. Recommend ibuprofen or Tylenol for pain. Follow-up with maxillofacial surgeon. Discussed return  precautions. Patient agrees an Field seismologist the above plan for discharge.  I personally performed the services described in this documentation, which was scribed in my presence. The recorded information has been reviewed and is accurate.     Cheri Fowler, PA-C 10/16/15 1849  Geoffery Lyons, MD 10/17/15 [phone]

## 2015-10-16 NOTE — ED Notes (Signed)
Pt reports he was feeding his horse and the horse reared its head and hit him under the left eye. Bruising noted. States he was spitting up blood and blood from the nose. Sent here by PCP for CT scan. No blurred vision, but headache.

## 2016-05-10 ENCOUNTER — Ambulatory Visit (INDEPENDENT_AMBULATORY_CARE_PROVIDER_SITE_OTHER): Payer: BC Managed Care – PPO | Admitting: Family Medicine

## 2016-05-10 ENCOUNTER — Encounter: Payer: Self-pay | Admitting: Family Medicine

## 2016-05-10 VITALS — BP 126/74 | HR 88 | Temp 98.2°F | Wt 239.5 lb

## 2016-05-10 DIAGNOSIS — W57XXXA Bitten or stung by nonvenomous insect and other nonvenomous arthropods, initial encounter: Secondary | ICD-10-CM | POA: Diagnosis not present

## 2016-05-10 DIAGNOSIS — R5383 Other fatigue: Secondary | ICD-10-CM | POA: Diagnosis not present

## 2016-05-10 LAB — COMPREHENSIVE METABOLIC PANEL
ALT: 33 U/L (ref 0–53)
AST: 19 U/L (ref 0–37)
Albumin: 4.3 g/dL (ref 3.5–5.2)
Alkaline Phosphatase: 71 U/L (ref 39–117)
BILIRUBIN TOTAL: 0.6 mg/dL (ref 0.2–1.2)
BUN: 13 mg/dL (ref 6–23)
CALCIUM: 9.2 mg/dL (ref 8.4–10.5)
CHLORIDE: 107 meq/L (ref 96–112)
CO2: 28 meq/L (ref 19–32)
CREATININE: 0.92 mg/dL (ref 0.40–1.50)
GFR: 91.2 mL/min (ref >60.00)
GLUCOSE: 112 mg/dL — AB (ref 70–99)
Potassium: 4 mEq/L (ref 3.5–5.1)
SODIUM: 142 meq/L (ref 135–145)
Total Protein: 6.7 g/dL (ref 6.0–8.3)

## 2016-05-10 LAB — CBC WITH DIFFERENTIAL/PLATELET
BASOS ABS: 0 10*3/uL (ref 0.0–0.1)
Basophils Relative: 0.6 % (ref 0.0–3.0)
EOS PCT: 3.8 % (ref 0.0–5.0)
Eosinophils Absolute: 0.3 10*3/uL (ref 0.0–0.7)
HCT: 45.9 % (ref 39.0–52.0)
HEMOGLOBIN: 15.9 g/dL (ref 13.0–17.0)
LYMPHS ABS: 2.8 10*3/uL (ref 0.7–4.0)
Lymphocytes Relative: 39.8 % (ref 12.0–46.0)
MCHC: 34.7 g/dL (ref 30.0–36.0)
MCV: 90.7 fl (ref 78.0–100.0)
MONO ABS: 0.7 10*3/uL (ref 0.1–1.0)
Monocytes Relative: 9.3 % (ref 3.0–12.0)
NEUTROS PCT: 46.5 % (ref 43.0–77.0)
Neutro Abs: 3.3 10*3/uL (ref 1.4–7.7)
Platelets: 228 10*3/uL (ref 150.0–400.0)
RBC: 5.06 Mil/uL (ref 4.22–5.81)
RDW: 12.4 % (ref 11.5–15.5)
WBC: 7.1 10*3/uL (ref 4.0–10.5)

## 2016-05-10 LAB — TSH: TSH: 2.54 u[IU]/mL (ref 0.35–4.50)

## 2016-05-10 LAB — VITAMIN D 25 HYDROXY (VIT D DEFICIENCY, FRACTURES): VITD: 30.86 ng/mL (ref 30.00–100.00)

## 2016-05-10 LAB — VITAMIN B12: VITAMIN B 12: 455 pg/mL (ref 211–911)

## 2016-05-10 LAB — PSA: PSA: 0.52 ng/mL (ref 0.10–4.00)

## 2016-05-10 LAB — TESTOSTERONE: TESTOSTERONE: 342.92 ng/dL (ref 300.00–890.00)

## 2016-05-10 NOTE — Addendum Note (Signed)
Addended by: Alvina ChouWALSH, Petina Muraski J on: 05/10/2016 10:49 AM   Modules accepted: Orders

## 2016-05-10 NOTE — Assessment & Plan Note (Signed)
New- progressive. Denies feeling depressed or anxious. Exam reassuring. Check labs as part of initial work up. The patient indicates understanding of these issues and agrees with the plan. Orders Placed This Encounter  Procedures  . Testosterone  . CBC with Differential/Platelet  . Vitamin B12  . Vitamin D, 25-hydroxy  . Comprehensive metabolic panel  . PSA  . TSH

## 2016-05-10 NOTE — Progress Notes (Signed)
Pre visit review using our clinic review tool, if applicable. No additional management support is needed unless otherwise documented below in the visit note. 

## 2016-05-10 NOTE — Progress Notes (Signed)
Subjective:   Patient ID: Eric Pierce, male    DOB: 03-Dec-1960, 55 y.o.   MRN: [id number]  Eric Pierce is a pleasant 55 y.o. year old male with h/o HLD and meniere's disease,  who presents to clinic today with Fatigue  on 05/10/2016  HPI:  Past several weeks, progressive fatigue.  Feels he is sleeping ok.  Wife says he is not snoring at night. "He used to snore but doesn't snore much now."   Feels "like I'm dragging." No changes in work schedule or diet.  Colonoscopy 06/24/15  No CP or SOB- very physically active- throws bails of hay.   Lab Results  Component Value Date   TSH 1.97 01/14/2015   Lab Results  Component Value Date   WBC 7.7 01/14/2015   HGB 16.1 01/14/2015   HCT 47.4 01/14/2015   MCV 91.6 01/14/2015   PLT 237.0 01/14/2015   Lab Results  Component Value Date   PSA 0.47 11/04/2014   PSA 0.43 11/15/2013   PSA 0.49 01/12/2012   Lab Results  Component Value Date   NA 138 01/14/2015   K 4.1 01/14/2015   CL 102 01/14/2015   CO2 29 01/14/2015   Lab Results  Component Value Date   ALT 33 01/14/2015   AST 22 01/14/2015   ALKPHOS 83 01/14/2015   BILITOT 0.7 01/14/2015   Current Outpatient Prescriptions on File Prior to Visit  Medication Sig Dispense Refill  . aspirin (ASPIRIN LOW DOSE) 81 MG tablet Take 81 mg by mouth daily.      . cetirizine (ZYRTEC) 10 MG tablet Take 10 mg by mouth daily.    . diazepam (VALIUM) 5 MG tablet Take 1 tablet by mouth as needed.    . Omega-3 Fatty Acids (FISH OIL) 1000 MG CAPS Take 2 capsules by mouth daily.     Marland Kitchen. triamterene-hydrochlorothiazide (DYAZIDE) 37.5-25 MG per capsule Take 1 capsule by mouth daily.    Marland Kitchen. VYTORIN 10-20 MG per tablet TAKE ONE TABLET BY MOUTH EVERY NIGHT AT BEDTIME (Patient taking differently: TAKE ONE TABLET BY MOUTH 3 times a week) 30 tablet 5   No current facility-administered medications on file prior to visit.     Allergies  Allergen Reactions  . Hydrocodone     Severe vomiting  .  Morphine And Related     Caused nausea and vomiting    Past Medical History:  Diagnosis Date  . Anxiety   . Meniere disease   . Post-operative nausea and vomiting     Past Surgical History:  Procedure Laterality Date  [facility]  [facility] and 2013   ENT - Dr. Joneen Roachrosley  . SHOULDER SURGERY  04/2002   left    Family History  Problem Relation Age of Onset  . Heart disease Father   . Cancer Father     prostate  . Heart disease Brother   . Colon cancer Paternal Grandmother     Social History   Social History  . Marital status: Married    Spouse name: N/A  . Number of children: 2  . Years of education: N/A   Occupational History  . Landscaper    Social History Main Topics  . Smoking status: Never Smoker  . Smokeless tobacco: Never Used  . Alcohol use No  . Drug use: No  . Sexual activity: Not on file   Other Topics Concern  . Not on file   Social History Narrative  . No narrative  on file   The PMH, PSH, Social History, Family History, Medications, and allergies have been reviewed in [facility], and have been updated if relevant.   Review of Systems  Constitutional: Positive for fatigue. Negative for unexpected weight change.  HENT: Negative.   Eyes: Negative.   Respiratory: Negative.   Cardiovascular: Negative.   Gastrointestinal: Negative.   Endocrine: Negative.   Genitourinary: Negative.   Musculoskeletal: Negative.   Skin: Negative.   Allergic/Immunologic: Negative.   Neurological: Negative.   Hematological: Negative.   Psychiatric/Behavioral: Negative.   All other systems reviewed and are negative.      Objective:    BP 126/74   Pulse 88   Temp 98.2 F (36.8 C) (Oral)   Wt 239 lb 8 oz (108.6 kg)   SpO2 98%   BMI 31.60 kg/m   Wt Readings from Last 3 Encounters:  05/10/16 239 lb 8 oz (108.6 kg)  10/01/15 245 lb (111.1 kg)  08/05/15 250 lb 4 oz (113.5 kg)    Physical Exam  Constitutional: He is oriented to person, place,  and time. He appears well-developed and well-nourished. No distress.  HENT:  Head: Normocephalic.  Eyes: Conjunctivae are normal.  Neck: Neck supple.  Cardiovascular: Normal rate, regular rhythm and normal heart sounds.   Pulmonary/Chest: Effort normal and breath sounds normal. No respiratory distress. He has no wheezes.  Abdominal: Soft.  Musculoskeletal: Normal range of motion. He exhibits no edema.  Neurological: He is alert and oriented to person, place, and time. No cranial nerve deficit.  Skin: Skin is warm and dry. No rash noted. No erythema. No pallor.  Psychiatric: He has a normal mood and affect. His behavior is normal. Judgment and thought content normal.  Nursing note and vitals reviewed.         Assessment & Plan:   Other fatigue No Follow-up on file.

## 2016-05-11 LAB — LYME AB/WESTERN BLOT REFLEX

## 2016-05-13 ENCOUNTER — Other Ambulatory Visit: Payer: Self-pay | Admitting: Family Medicine

## 2016-05-13 NOTE — Telephone Encounter (Signed)
No lipid labs since 10/2014, please advise

## 2016-05-13 NOTE — Telephone Encounter (Signed)
Ok to refill.  Needs labs for further refills.

## 2016-05-14 LAB — ROCKY MTN SPOTTED FVR ABS PNL(IGG+IGM)
RMSF IGG: NOT DETECTED
RMSF IGM: NOT DETECTED

## 2016-05-15 ENCOUNTER — Other Ambulatory Visit: Payer: Self-pay | Admitting: Family Medicine

## 2016-05-15 DIAGNOSIS — R7989 Other specified abnormal findings of blood chemistry: Secondary | ICD-10-CM

## 2016-06-03 ENCOUNTER — Encounter: Payer: Self-pay | Admitting: Urology

## 2016-06-03 ENCOUNTER — Ambulatory Visit (INDEPENDENT_AMBULATORY_CARE_PROVIDER_SITE_OTHER): Payer: BC Managed Care – PPO | Admitting: Urology

## 2016-06-03 VITALS — BP 126/86 | HR 103 | Ht 72.0 in | Wt 234.0 lb

## 2016-06-03 DIAGNOSIS — R5383 Other fatigue: Secondary | ICD-10-CM | POA: Diagnosis not present

## 2016-06-03 DIAGNOSIS — Z125 Encounter for screening for malignant neoplasm of prostate: Secondary | ICD-10-CM

## 2016-06-03 NOTE — Progress Notes (Signed)
06/03/2016 3:38 PM   Eric ChenGarland K Pierce 1960-09-03 [id number]  Referring provider: Dianne Dunalia M Aron, MD [address] [place], [place] [address]  Chief Complaint  Patient presents with  . New Patient (Initial Visit)    fatigue    HPI: The patient is a 55 year old gentleman presents for evaluation of fatigue. This has been going on for approximately 6 months. He feels he has no energy and this is affecting his quality of life. She is able to obtain and maintain erections sufficient for intercourse. His libido is normal. He has no history of nephrolithiasis. He has no urinary complaints.  He had a testosterone checked in October 2017 which was 342. One year prior was 409.  He has a strong family history of prostate cancer. His father had a radical prostatectomy in his 10260s. Both his grandfathers also had prostate cancer, and one of them died from it.  PSA in October 2017 was 0.52. It was 0.47 one year prior.  PMH: Past Medical History:  Diagnosis Date  . Anxiety   . Meniere disease   . Post-operative nausea and vomiting     Surgical History: Past Surgical History:  Procedure Laterality Date  . [facility]  [facility] and 2013   ENT - Dr. Joneen Roachrosley  . MASTOIDECTOMY  2013  . SHOULDER SURGERY  04/2002   left    Home Medications:    Medication List       Accurate as of 06/03/16  3:38 PM. Always use your most recent med list.          ASPIRIN LOW DOSE 81 MG tablet Generic drug:  aspirin Take 81 mg by mouth daily.   cetirizine 10 MG tablet Commonly known as:  ZYRTEC Take 10 mg by mouth daily.   diazepam 5 MG tablet Commonly known as:  VALIUM Take 1 tablet by mouth as needed.   ezetimibe-simvastatin 10-20 MG tablet Commonly known as:  VYTORIN Take 1 tablet by mouth at bedtime. **needs to schedule lap appt before future refills are given**   Fish Oil 1000 MG Caps Take 2 capsules by mouth daily.   triamterene-hydrochlorothiazide 37.5-25 MG  capsule Commonly known as:  DYAZIDE Take 1 capsule by mouth daily.       Allergies:  Allergies  Allergen Reactions  . Hydrocodone     Severe vomiting  . Morphine And Related     Caused nausea and vomiting    Family History: Family History  Problem Relation Age of Onset  . Heart disease Father   . Cancer Father     prostate  . Heart disease Brother   . Colon cancer Paternal Grandmother   . Prostate cancer Paternal Grandfather     Social History:  reports that he has never smoked. He has never used smokeless tobacco. He reports that he does not drink alcohol or use drugs.  ROS: UROLOGY Frequent Urination?: Yes Hard to postpone urination?: Yes Burning/pain with urination?: No Get up at night to urinate?: No Leakage of urine?: No Urine stream starts and stops?: No Trouble starting stream?: No Do you have to strain to urinate?: No Blood in urine?: No Urinary tract infection?: No Sexually transmitted disease?: No Injury to kidneys or bladder?: No Painful intercourse?: No Weak stream?: No Erection problems?: No Penile pain?: No  Gastrointestinal Nausea?: No Vomiting?: No Indigestion/heartburn?: No Diarrhea?: No Constipation?: No  Constitutional Fever: No Night sweats?: No Weight loss?: No Fatigue?: Yes  Skin Skin rash/lesions?: No Itching?: No  Eyes Blurred vision?: No Double vision?: No  Ears/Nose/Throat Sore throat?: No Sinus problems?: No  Hematologic/Lymphatic Swollen glands?: No Easy bruising?: No  Cardiovascular Leg swelling?: No Chest pain?: No  Respiratory Cough?: No Shortness of breath?: No  Endocrine Excessive thirst?: No  Musculoskeletal Back pain?: No Joint pain?: No  Neurological Headaches?: No Dizziness?: No  Psychologic Depression?: No Anxiety?: No  Physical Exam: BP 126/86   Pulse (!) 103   Ht 6' (1.829 m)   Wt 234 lb (106.1 kg)   BMI 31.74 kg/m   Constitutional:  Alert and oriented, No acute  distress. HEENT: [place] AT, moist mucus membranes.  Trachea midline, no masses. Cardiovascular: No clubbing, cyanosis, or edema. Respiratory: Normal respiratory effort, no increased work of breathing. GI: Abdomen is soft, nontender, nondistended, no abdominal masses GU: No CVA tenderness. DRE: 1+ benign. Skin: No rashes, bruises or suspicious lesions. Lymph: No cervical or inguinal adenopathy. Neurologic: Grossly intact, no focal deficits, moving all 4 extremities. Psychiatric: Normal mood and affect.  Laboratory Data: Lab Results  Component Value Date   WBC 7.1 05/10/2016   HGB 15.9 05/10/2016   HCT 45.9 05/10/2016   MCV 90.7 05/10/2016   PLT 228.0 05/10/2016    Lab Results  Component Value Date   CREATININE 0.92 05/10/2016    Lab Results  Component Value Date   PSA 0.52 05/10/2016   PSA 0.47 11/04/2014   PSA 0.43 11/15/2013    Lab Results  Component Value Date   TESTOSTERONE 342.92 05/10/2016    Lab Results  Component Value Date   HGBA1C 5.7 08/07/2012    Urinalysis    Component Value Date/Time   COLORURINE YELLOW 06/19/2009 2017   APPEARANCEUR CLEAR 06/19/2009 2017   LABSPEC 1.014 06/19/2009 2017   PHURINE 6.5 06/19/2009 2017   GLUCOSEU NEGATIVE 06/19/2009 2017   HGBUR SMALL (A) 06/19/2009 2017   BILIRUBINUR negative 11/04/2014 0913   KETONESUR NEGATIVE 06/19/2009 2017   PROTEINUR Positive 11/04/2014 0913   PROTEINUR 30 (A) 06/19/2009 2017   UROBILINOGEN 0.2 11/04/2014 0913   UROBILINOGEN 0.2 06/19/2009 2017   NITRITE negative 11/04/2014 0913   NITRITE NEGATIVE 06/19/2009 2017   LEUKOCYTESUR Negative 11/04/2014 0913    Assessment & Plan:    1. Fatigue The patient's testosterone is in the normal range, and therefore he does not meet criteria for hypogonadism. I do not think his testosterone level is the source of his fatigue. He can follow up with us as needed.  2. Prostate cancer screening Up to date. Strongly recommend both PSA and DRE annually  given the patient's strong family history of prostate cancer. This could be done by his primary care provider or in our office.  Return if symptoms worsen or fail to improve.  Hildred LaserBrian James Zinnia Tindall, MD  [facility] [address], [address] [place], [place] [address] [phone]

## 2016-08-19 ENCOUNTER — Other Ambulatory Visit: Payer: Self-pay | Admitting: Family Medicine

## 2016-08-23 ENCOUNTER — Other Ambulatory Visit: Payer: Self-pay

## 2016-08-23 MED ORDER — EZETIMIBE-SIMVASTATIN 10-20 MG PO TABS: 1.0000 | ORAL_TABLET | Freq: Every day | ORAL | 0 refills | Status: DC

## 2016-08-23 NOTE — Telephone Encounter (Signed)
V/M left [facility] can cb and schedule appt. Last lipid done 11/04/2014.Please advise.

## 2016-08-23 NOTE — Telephone Encounter (Signed)
eRx refill sent.  Needs labs for further rills.

## 2016-09-19 ENCOUNTER — Other Ambulatory Visit: Payer: Self-pay | Admitting: Family Medicine

## 2016-10-07 ENCOUNTER — Telehealth: Payer: Self-pay | Admitting: Family Medicine

## 2016-10-07 NOTE — Telephone Encounter (Signed)
noted 

## 2016-10-07 NOTE — Telephone Encounter (Signed)
I spoke with pt and he has had dizziness on and off for 3-4 days with head congestion and lt earache; [facility] [place] [facility]. Pt scheduled to see Dr Alphonsus SiasLetvak on 10/08/16 at 8:15. Pt will cb if condition worsens prior to appt or go to UC at nighttime if needed.

## 2016-10-07 NOTE — Telephone Encounter (Signed)
Patient Name: Eric Pierce (KEITH) Mcconaughey  DOB: [birth year]    Initial Comment Caller states her husband got a cold a couple of weeks ago. He's dizzy.   Nurse Assessment      Guidelines    Guideline Title Affirmed Question Affirmed Notes       Final Disposition User   FINAL ATTEMPT MADE - no message left Reed Pandyamsey, RN, Amy

## 2016-10-08 ENCOUNTER — Encounter: Payer: Self-pay | Admitting: Internal Medicine

## 2016-10-08 ENCOUNTER — Ambulatory Visit (INDEPENDENT_AMBULATORY_CARE_PROVIDER_SITE_OTHER): Payer: BC Managed Care – PPO | Admitting: Internal Medicine

## 2016-10-08 VITALS — BP 142/82 | HR 91 | Temp 98.4°F | Wt 245.2 lb

## 2016-10-08 DIAGNOSIS — J0140 Acute pansinusitis, unspecified: Secondary | ICD-10-CM | POA: Insufficient documentation

## 2016-10-08 MED ORDER — AMOXICILLIN 500 MG PO TABS: 1000.0000 mg | ORAL_TABLET | Freq: Two times a day (BID) | ORAL | 0 refills | Status: AC

## 2016-10-08 NOTE — Assessment & Plan Note (Signed)
Not clear this is bacterial Discussed symptom relief--continue zyrtec and flonase If worsens--fill and start the amoxil

## 2016-10-08 NOTE — Progress Notes (Signed)
   Subjective:    Patient ID: Eric Pierce, male    DOB: Mar 12, 1961, 56 y.o.   MRN: [id number]  HPI Here due to dizziness  Wife got flu 3 weeks ago He caught something from her--though not as bad. 2 weeks ago Body aches, runny eyes, congestion  Now has been dizzy over the past 6 days With blowing nose or turning head Known Meniere's---- but not vomiting, etc like with those spells Left ear feels congested Some PND and some productive cough Takes zyrtec and added flonase/mucinex  No fever No SOB  Current Outpatient Prescriptions on File Prior to Visit  Medication Sig Dispense Refill  . aspirin (ASPIRIN LOW DOSE) 81 MG tablet Take 81 mg by mouth daily.      . cetirizine (ZYRTEC) 10 MG tablet Take 10 mg by mouth daily.    . diazepam (VALIUM) 5 MG tablet Take 1 tablet by mouth as needed.    . ezetimibe-simvastatin (VYTORIN) 10-20 MG tablet TAKE 1 TABLET BY MOUTH AT BEDTIME 30 tablet 0  . Omega-3 Fatty Acids (FISH OIL) 1000 MG CAPS Take 2 capsules by mouth daily.     Marland Kitchen. triamterene-hydrochlorothiazide (DYAZIDE) 37.5-25 MG per capsule Take 1 capsule by mouth daily.     No current facility-administered medications on file prior to visit.     Allergies  Allergen Reactions  . Hydrocodone     Severe vomiting  . Morphine And Related     Caused nausea and vomiting    Past Medical History:  Diagnosis Date  . Anxiety   . Meniere disease   . Post-operative nausea and vomiting     Past Surgical History:  Procedure Laterality Date  . [facility]  [facility]   ENT - Dr. Joneen Roachrosley  . MASTOIDECTOMY  2013  . SHOULDER SURGERY  04/2002   left    Family History  Problem Relation Age of Onset  . Heart disease Father   . Cancer Father     prostate  . Heart disease Brother   . Colon cancer Paternal Grandmother   . Prostate cancer Paternal Grandfather     Social History   Social History  . Marital status: Married    Spouse name: N/A  . Number of  children: 2  . Years of education: N/A   Occupational History  . Landscaper    Social History Main Topics  . Smoking status: Never Smoker  . Smokeless tobacco: Never Used  . Alcohol use No  . Drug use: No  . Sexual activity: Not on file   Other Topics Concern  . Not on file   Social History Narrative  . No narrative on file   Review of Systems  No rash No vomiting or diarrhea Appetite is okay No headache Bad allergies but not prone to sinus infections     Objective:   Physical Exam  Constitutional: He appears well-nourished. No distress.  HENT:  Mouth/Throat: Oropharynx is clear and moist. No oropharyngeal exudate.  No sinus tenderness TMs normal Moderate nasal inflammation  Neck: No thyromegaly present.  Pulmonary/Chest: Effort normal and breath sounds normal. No respiratory distress. He has no wheezes. He has no rales.  Lymphadenopathy:    He has no cervical adenopathy.          Assessment & Plan:

## 2016-10-08 NOTE — Progress Notes (Signed)
Pre visit review using our clinic review tool, if applicable. No additional management support is needed unless otherwise documented below in the visit note. 

## 2016-10-08 NOTE — Patient Instructions (Signed)
Start the antibiotic if you worsen over the next few days.

## 2017-01-15 ENCOUNTER — Other Ambulatory Visit: Payer: Self-pay | Admitting: Family Medicine

## 2017-02-25 ENCOUNTER — Other Ambulatory Visit: Payer: Self-pay | Admitting: Family Medicine

## 2017-04-14 ENCOUNTER — Ambulatory Visit (INDEPENDENT_AMBULATORY_CARE_PROVIDER_SITE_OTHER): Payer: BC Managed Care – PPO | Admitting: Family Medicine

## 2017-04-14 ENCOUNTER — Encounter: Payer: Self-pay | Admitting: Family Medicine

## 2017-04-14 VITALS — BP 130/80 | HR 92 | Temp 98.7°F | Ht 72.0 in | Wt 240.0 lb

## 2017-04-14 DIAGNOSIS — Z23 Encounter for immunization: Secondary | ICD-10-CM | POA: Diagnosis not present

## 2017-04-14 DIAGNOSIS — W57XXXA Bitten or stung by nonvenomous insect and other nonvenomous arthropods, initial encounter: Secondary | ICD-10-CM

## 2017-04-14 DIAGNOSIS — R5383 Other fatigue: Secondary | ICD-10-CM | POA: Diagnosis not present

## 2017-04-14 DIAGNOSIS — T148XXA Other injury of unspecified body region, initial encounter: Secondary | ICD-10-CM | POA: Diagnosis not present

## 2017-04-14 LAB — COMPREHENSIVE METABOLIC PANEL
ALK PHOS: 71 U/L (ref 39–117)
ALT: 44 U/L (ref 0–53)
AST: 26 U/L (ref 0–37)
Albumin: 4.5 g/dL (ref 3.5–5.2)
BUN: 13 mg/dL (ref 6–23)
CHLORIDE: 100 meq/L (ref 96–112)
CO2: 30 meq/L (ref 19–32)
Calcium: 10.1 mg/dL (ref 8.4–10.5)
Creatinine, Ser: 0.92 mg/dL (ref 0.40–1.50)
GFR: 90.89 mL/min (ref >60.00)
GLUCOSE: 104 mg/dL — AB (ref 70–99)
POTASSIUM: 4 meq/L (ref 3.5–5.1)
Sodium: 137 mEq/L (ref 135–145)
Total Bilirubin: 0.6 mg/dL (ref 0.2–1.2)
Total Protein: 7.6 g/dL (ref 6.0–8.3)

## 2017-04-14 LAB — CBC WITH DIFFERENTIAL/PLATELET
Basophils Absolute: 0.1 10*3/uL (ref 0.0–0.1)
Basophils Relative: 0.8 % (ref 0.0–3.0)
EOS PCT: 3.4 % (ref 0.0–5.0)
Eosinophils Absolute: 0.2 10*3/uL (ref 0.0–0.7)
HCT: 47.7 % (ref 39.0–52.0)
Hemoglobin: 16.6 g/dL (ref 13.0–17.0)
LYMPHS ABS: 2.9 10*3/uL (ref 0.7–4.0)
Lymphocytes Relative: 43.4 % (ref 12.0–46.0)
MCHC: 34.7 g/dL (ref 30.0–36.0)
MCV: 90.1 fl (ref 78.0–100.0)
MONOS PCT: 9.4 % (ref 3.0–12.0)
Monocytes Absolute: 0.6 10*3/uL (ref 0.1–1.0)
NEUTROS ABS: 2.9 10*3/uL (ref 1.4–7.7)
NEUTROS PCT: 43 % (ref 43.0–77.0)
PLATELETS: 239 10*3/uL (ref 150.0–400.0)
RBC: 5.29 Mil/uL (ref 4.22–5.81)
RDW: 12.4 % (ref 11.5–15.5)
WBC: 6.6 10*3/uL (ref 4.0–10.5)

## 2017-04-14 LAB — TSH: TSH: 2.51 u[IU]/mL (ref 0.35–4.50)

## 2017-04-14 LAB — VITAMIN B12: Vitamin B-12: 846 pg/mL (ref 211–911)

## 2017-04-14 LAB — VITAMIN D 25 HYDROXY (VIT D DEFICIENCY, FRACTURES): VITD: 27.11 ng/mL — ABNORMAL LOW (ref 30.00–100.00)

## 2017-04-14 NOTE — Addendum Note (Signed)
Addended by: Judd Gaudier on: 04/14/2017 11:54 AM   Modules accepted: Orders

## 2017-04-14 NOTE — Addendum Note (Signed)
Addended by: Alvina Chou on: 04/14/2017 01:03 PM   Modules accepted: Orders

## 2017-04-14 NOTE — Assessment & Plan Note (Signed)
Likely multifactorial.  No abnormalities noted on exam. Will start work up with blood work. The patient indicates understanding of these issues and agrees with the plan. Orders Placed This Encounter  Procedures  . Rocky mtn spotted fvr abs pnl(IgG+IgM)  . Lyme Ab/Western Blot Reflex  . Rheumatoid factor  . CBC with Differential/Platelet  . Comprehensive metabolic panel  . TSH  . Vitamin B12  . Vitamin D, 25-hydroxy

## 2017-04-14 NOTE — Progress Notes (Signed)
Subjective:   Patient ID: Eric Pierce, male    DOB: 10-29-1960, 56 y.o.   MRN: [id number]  Eric Pierce is a pleasant 56 y.o. year old male who presents to clinic today with Fatigue and Insect Bite (tick bite)  on 04/14/2017  HPI:  Past two weeks- progressive fatigue and intermittent arthralgias- knees, backs hands.  No rashes.  No HA. No nausea, vomiting or photophobia.  H/o several tick bites a month ago.  Current Outpatient Prescriptions on File Prior to Visit  Medication Sig Dispense Refill  . aspirin (ASPIRIN LOW DOSE) 81 MG tablet Take 81 mg by mouth daily.      . diazepam (VALIUM) 5 MG tablet Take 1 tablet by mouth as needed.    . ezetimibe-simvastatin (VYTORIN) 10-20 MG tablet TAKE 1 TABLET BY MOUTH AT BEDTIME. MUST SCHEDULE FASTING OFFICE VISIT BEFORE NEXT REFILL 15 tablet 0  . Omega-3 Fatty Acids (FISH OIL) 1000 MG CAPS Take 2 capsules by mouth daily.     Marland Kitchen triamterene-hydrochlorothiazide (DYAZIDE) 37.5-25 MG per capsule Take 1 capsule by mouth daily.     No current facility-administered medications on file prior to visit.     Allergies  Allergen Reactions  . Hydrocodone     Severe vomiting  . Morphine And Related     Caused nausea and vomiting    Past Medical History:  Diagnosis Date  . Anxiety   . Meniere disease   . Post-operative nausea and vomiting     Past Surgical History:  Procedure Laterality Date  . ENDOLYMPHATIC [facility] OPERATION  02/2011 and 2013   ENT - Dr. Joneen Roach  . MASTOIDECTOMY  2013  . SHOULDER SURGERY  04/2002   left    Family History  Problem Relation Age of Onset  . Heart disease Father   . Cancer Father        prostate  . Heart disease Brother   . Colon cancer Paternal Grandmother   . Prostate cancer Paternal Grandfather     Social History   Social History  . Marital status: Married    Spouse name: N/A  . Number of children: 2  . Years of education: N/A   Occupational History  . Landscaper    Social History  Main Topics  . Smoking status: Never Smoker  . Smokeless tobacco: Never Used  . Alcohol use No  . Drug use: No  . Sexual activity: Not on file   Other Topics Concern  . Not on file   Social History Narrative  . No narrative on file   The PMH, PSH, Social History, Family History, Medications, and allergies have been reviewed in [facility], and have been updated if relevant.   Review of Systems  Constitutional: Positive for fatigue. Negative for diaphoresis.  Respiratory: Negative.   Cardiovascular: Negative.   Gastrointestinal: Negative.   Musculoskeletal: Positive for arthralgias. Negative for back pain, gait problem, joint swelling, myalgias, neck pain and neck stiffness.  Skin: Positive for rash.  Neurological: Negative.   All other systems reviewed and are negative.      Objective:    BP 130/80   Pulse 92   Temp 98.7 F (37.1 C) (Oral)   Ht 6' (1.829 m)   Wt 240 lb (108.9 kg)   SpO2 98%   BMI 32.55 kg/m    Physical Exam  Constitutional: He is oriented to person, place, and time. He appears well-developed and well-nourished. No distress.  HENT:  Head: Normocephalic and atraumatic.  Eyes: Conjunctivae are normal.  Cardiovascular: Normal rate and regular rhythm.   Pulmonary/Chest: Effort normal and breath sounds normal.  Musculoskeletal: Normal range of motion. He exhibits no edema.  Neurological: He is alert and oriented to person, place, and time. No cranial nerve deficit. Coordination normal.  Skin: Skin is warm and dry. No rash noted. He is not diaphoretic.  Psychiatric: He has a normal mood and affect. His behavior is normal. Judgment and thought content normal.  Nursing note and vitals reviewed.         Assessment & Plan:   Fatigue, unspecified type - Plan: Rocky mtn spotted fvr abs pnl(IgG+IgM), Lyme Ab/Western Blot Reflex, Rheumatoid factor, CBC with Differential/Platelet, Comprehensive metabolic panel, TSH, Vitamin B12, Vitamin D, 25-hydroxy  Tick  bite, initial encounter No Follow-up on file.

## 2017-04-15 LAB — B. BURGDORFI ANTIBODIES BY WB

## 2017-04-18 LAB — RHEUMATOID FACTOR: Rhuematoid fact SerPl-aCnc: <14 IU/mL (ref <14)

## 2017-04-18 LAB — ROCKY MTN SPOTTED FVR ABS PNL(IGG+IGM)
RMSF IGG: NOT DETECTED
RMSF IGM: NOT DETECTED

## 2017-06-13 ENCOUNTER — Other Ambulatory Visit: Payer: Self-pay | Admitting: Family Medicine

## 2017-09-19 ENCOUNTER — Other Ambulatory Visit: Payer: Self-pay | Admitting: Family Medicine

## 2017-09-19 NOTE — Telephone Encounter (Signed)
Copied from CRM [id number]. Topic: Quick Communication - Rx Refill/Question >> Sep 19, 2017  9:58 AM Alexander BergeronBarksdale, Harvey B wrote: Medication: ezetimibe-simvastatin (VYTORIN) 10-20 MG tablet [[id number]][[id number]]    Has the patient contacted their pharmacy? No.   (Agent: If no, request that the patient contact the pharmacy for the refill.)   Preferred Pharmacy (with phone number or street name): [facility]   Agent: Please be advised that RX refills may take up to 3 business days. We ask that you follow-up with your pharmacy.  Pt would also like to talk about seeing a counselor

## 2017-09-19 NOTE — Telephone Encounter (Signed)
Refill request for Vytorin / Per last refill: ezetimibe-simvastatin (VYTORIN) 10-20 MG tablet 30 tablet 0 06/13/2017    Sig - Route: TAKE 1 TABLET BY MOUTH AT BEDTIME. MUST SCHEDULE FASTING OFFICE VISIT BEFORE NEXT REFILL - Oral   Sent to pharmacy as: ezetimibe-simvastatin (VYTORIN) 10-20 MG tablet   E-Prescribing Status: Receipt confirmed by pharmacy (06/13/2017 1:57 PM EST)    / Patient has an appointment scheduled for 09/02/17 at 0730. /

## 2017-09-22 ENCOUNTER — Ambulatory Visit: Payer: BC Managed Care – PPO | Admitting: Family Medicine

## 2017-09-22 ENCOUNTER — Encounter: Payer: Self-pay | Admitting: Family Medicine

## 2017-09-22 VITALS — BP 132/86 | HR 87 | Temp 99.3°F | Ht 72.0 in | Wt 246.4 lb

## 2017-09-22 DIAGNOSIS — F339 Major depressive disorder, recurrent, unspecified: Secondary | ICD-10-CM

## 2017-09-22 DIAGNOSIS — E782 Mixed hyperlipidemia: Secondary | ICD-10-CM | POA: Diagnosis not present

## 2017-09-22 DIAGNOSIS — Z125 Encounter for screening for malignant neoplasm of prostate: Secondary | ICD-10-CM | POA: Diagnosis not present

## 2017-09-22 LAB — COMPREHENSIVE METABOLIC PANEL WITH GFR
ALT: 42 U/L (ref 0–53)
AST: 22 U/L (ref 0–37)
Albumin: 4.1 g/dL (ref 3.5–5.2)
Alkaline Phosphatase: 76 U/L (ref 39–117)
BUN: 12 mg/dL (ref 6–23)
CO2: 29 meq/L (ref 19–32)
Calcium: 9.6 mg/dL (ref 8.4–10.5)
Chloride: 104 meq/L (ref 96–112)
Creatinine, Ser: 0.87 mg/dL (ref 0.40–1.50)
GFR: 96.78 mL/min
Glucose, Bld: 103 mg/dL — ABNORMAL HIGH (ref 70–99)
Potassium: 4.2 meq/L (ref 3.5–5.1)
Sodium: 140 meq/L (ref 135–145)
Total Bilirubin: 0.7 mg/dL (ref 0.2–1.2)
Total Protein: 7 g/dL (ref 6.0–8.3)

## 2017-09-22 LAB — LIPID PANEL
CHOL/HDL RATIO: 5
Cholesterol: 212 mg/dL — ABNORMAL HIGH (ref 0–200)
HDL: 39.6 mg/dL (ref >39.00)
LDL Cholesterol: 153 mg/dL — ABNORMAL HIGH (ref 0–99)
NONHDL: 172.11
Triglycerides: 96 mg/dL (ref 0.0–149.0)
VLDL: 19.2 mg/dL (ref 0.0–40.0)

## 2017-09-22 LAB — PSA: PSA: 0.58 ng/mL (ref 0.10–4.00)

## 2017-09-22 MED ORDER — BUPROPION HCL ER (XL) 150 MG PO TB24: 150.0000 mg | ORAL_TABLET | Freq: Every day | ORAL | 3 refills | Status: DC

## 2017-09-22 MED ORDER — EZETIMIBE-SIMVASTATIN 10-20 MG PO TABS: 1.0000 | ORAL_TABLET | Freq: Every day | ORAL | 6 refills | Status: DC

## 2017-09-22 NOTE — Patient Instructions (Signed)
Great to see you.  We are starting Wellbutrin XL 150 mg daily. We are referring you to see a therapist.  Please keep me updated.

## 2017-09-22 NOTE — Progress Notes (Signed)
Subjective:   Patient ID: Eric Pierce, male    DOB: 02-14-1961, 57 y.o.   MRN: [id number]  Eric Pierce is a pleasant 57 y.o. year old male who presents to clinic today with Follow-up (Patient is here today to F/U with medications.  He is currently fasting.  He has been without his Vytorin for 7d now.   He only takes qod when he has it.  He would like to have Lipid panel, PSA drawn. ) and Depression (He also states that he has been having problems with depression about 6-8 months now.  Plz see PSQ.)  on 09/22/2017  HPI:  HLD- Taking Vytorin every other day.  Has been out of it for 7 days now.  Lab Results  Component Value Date   CHOL 171 11/04/2014   HDL 42.10 11/04/2014   LDLCALC 104 (H) 11/04/2014   LDLDIRECT 231.5 06/12/2012   TRIG 125.0 11/04/2014   CHOLHDL 4 11/04/2014    Depression-  Feels he has been struggling with depression for past 6-8 months now.  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  Since he discovered this, he has had little interest or pleasure in doing things, tearful.  Not sleeping well. Difficulty concentrating.  No SI or HI.   Current Outpatient Medications on File Prior to Visit  Medication Sig Dispense Refill  . aspirin (ASPIRIN LOW DOSE) 81 MG tablet Take 81 mg by mouth daily.      . diazepam (VALIUM) 5 MG tablet Take 1 tablet by mouth as needed.    . ezetimibe-simvastatin (VYTORIN) 10-20 MG tablet TAKE 1 TABLET BY MOUTH AT BEDTIME. MUST SCHEDULE FASTING OFFICE VISIT BEFORE NEXT REFILL (Patient taking differently: Take 1 tablet by mouth at bedtime. Takes 1qod) 15 tablet 0  . fexofenadine (ALLEGRA) 30 MG tablet Take 30 mg by mouth 2 (two) times daily.    . Omega-3 Fatty Acids (FISH OIL) 1000 MG CAPS Take 2 capsules by mouth daily.     Marland Kitchen. triamterene-hydrochlorothiazide (DYAZIDE) 37.5-25 MG per capsule Take 1 capsule by mouth daily.     No current facility-administered medications on file  prior to visit.     Allergies  Allergen Reactions  . Hydrocodone     Severe vomiting  . Morphine And Related     Caused nausea and vomiting    Past Medical History:  Diagnosis Date  . Anxiety   . Meniere disease   . Post-operative nausea and vomiting     Past Surgical History:  Procedure Laterality Date  . [facility]  02/2011 and 2013   ENT - Dr. Joneen Roachrosley  . MASTOIDECTOMY  2013  . SHOULDER SURGERY  04/2002   left    Family History  Problem Relation Age of Onset  . Heart disease Father   . Cancer Father        prostate  . Heart disease Brother   . Colon cancer Paternal Grandmother   . Prostate cancer Paternal Grandfather     Social History   Socioeconomic History  . Marital status: Married    Spouse name: Not on file  . Number of children: 2  . Years of education: Not on file  . Highest education level: Not on file  Social Needs  . Financial resource strain: Not on file  . Food insecurity - worry: Not on file  . Food insecurity - inability: Not on file  .  Transportation needs - medical: Not on file  . Transportation needs - non-medical: Not on file  Occupational History  . Occupation: Landscaper  Tobacco Use  . Smoking status: Never Smoker  . Smokeless tobacco: Never Used  Substance and Sexual Activity  . Alcohol use: No    Alcohol/week: 0.0 oz  . Drug use: No  . Sexual activity: Not on file  Other Topics Concern  . Not on file  Social History Narrative  . Not on file   The PMH, PSH, Social History, Family History, Medications, and allergies have been reviewed in [facility], and have been updated if relevant.   Review of Systems  Constitutional: Positive for fatigue.  Cardiovascular: Negative.   Gastrointestinal: Negative.   Musculoskeletal: Negative.   Skin: Negative.   Psychiatric/Behavioral: Positive for decreased concentration, dysphoric mood and sleep disturbance. Negative for agitation, behavioral problems, confusion,  hallucinations, self-injury and suicidal ideas. The patient is not nervous/anxious and is not hyperactive.   All other systems reviewed and are negative.      Objective:    BP 132/86 (BP Location: Left Arm, Patient Position: Sitting, Cuff Size: Large)   Pulse 87   Temp 99.3 F (37.4 C) (Oral)   Ht 6' (1.829 m)   Wt 246 lb 6.4 oz (111.8 kg)   SpO2 98%   BMI 33.42 kg/m    Physical Exam  Constitutional: He is oriented to person, place, and time. He appears well-developed and well-nourished. No distress.  HENT:  Head: Normocephalic and atraumatic.  Eyes: Conjunctivae are normal.  Neck: Normal range of motion.  Cardiovascular: Normal rate.  Pulmonary/Chest: Effort normal.  Musculoskeletal: He exhibits no edema.  Neurological: He is alert and oriented to person, place, and time.  Skin: Skin is warm and dry. He is not diaphoretic.  Psychiatric: He has a normal mood and affect. His behavior is normal. Judgment and thought content normal.  Nursing note and vitals reviewed.         Assessment & Plan:   Depression, recurrent (HCC)  HYPERLIPIDEMIA - Plan: Lipid panel, Comprehensive metabolic panel  Screening for prostate cancer - Plan: PSA No Follow-up on file.

## 2017-09-22 NOTE — Assessment & Plan Note (Addendum)
Deteriorated. PH 9 of 19 today. >25 minutes spent in face to face time with patient, >50% spent in counselling or coordination of care discussing depression, HLD. Wellbutrin 150 mg XL daily, refer to psychotherapy. He will keep me updated in the next few weeks.

## 2017-09-22 NOTE — Assessment & Plan Note (Signed)
Continue vytorin. Labs today.

## 2017-10-03 ENCOUNTER — Ambulatory Visit: Payer: BC Managed Care – PPO | Admitting: Psychology

## 2017-10-03 DIAGNOSIS — F321 Major depressive disorder, single episode, moderate: Secondary | ICD-10-CM | POA: Diagnosis not present

## 2017-10-19 ENCOUNTER — Other Ambulatory Visit: Payer: Self-pay

## 2017-10-19 MED ORDER — BUPROPION HCL ER (XL) 150 MG PO TB24: 150.0000 mg | ORAL_TABLET | Freq: Every day | ORAL | 1 refills | Status: DC

## 2017-10-20 ENCOUNTER — Ambulatory Visit: Payer: BC Managed Care – PPO | Admitting: Psychology

## 2017-10-20 DIAGNOSIS — F321 Major depressive disorder, single episode, moderate: Secondary | ICD-10-CM

## 2017-11-03 ENCOUNTER — Ambulatory Visit: Payer: Self-pay | Admitting: Psychology

## 2017-11-17 ENCOUNTER — Ambulatory Visit: Payer: BC Managed Care – PPO | Admitting: Psychology

## 2017-11-18 ENCOUNTER — Ambulatory Visit: Payer: BC Managed Care – PPO | Admitting: Psychology

## 2017-11-18 DIAGNOSIS — F321 Major depressive disorder, single episode, moderate: Secondary | ICD-10-CM | POA: Diagnosis not present

## 2017-12-08 ENCOUNTER — Ambulatory Visit: Payer: BC Managed Care – PPO | Admitting: Psychology

## 2017-12-08 DIAGNOSIS — F320 Major depressive disorder, single episode, mild: Secondary | ICD-10-CM

## 2018-01-13 ENCOUNTER — Ambulatory Visit: Payer: BC Managed Care – PPO | Admitting: Psychology

## 2018-01-13 DIAGNOSIS — F320 Major depressive disorder, single episode, mild: Secondary | ICD-10-CM | POA: Diagnosis not present

## 2018-04-19 ENCOUNTER — Other Ambulatory Visit: Payer: Self-pay | Admitting: Family Medicine

## 2018-07-18 ENCOUNTER — Other Ambulatory Visit: Payer: Self-pay | Admitting: Family Medicine

## 2018-07-21 ENCOUNTER — Other Ambulatory Visit: Payer: Self-pay | Admitting: Family Medicine

## 2018-09-19 NOTE — Progress Notes (Signed)
Subjective:   Patient ID: Eric Pierce, male    DOB: 13-Jun-1961, 58 y.o.   MRN: [id number]  Eric Pierce is a pleasant 58 y.o. year old male who presents to clinic today with Depression (Patient is here today to F/U with depression.. Currently takes Wellbutrin XL  1qd.  He feels that the dosage needs to be increased.  Would also like PSA drawn today.) and Hyperlipidemia (He is also here today to F/U with HLD.  He currently takes Vytoril 10/40mg  1qod and Fish Oil 1k mg 2qd.  He is currently fasting.  )  on 09/20/2018  HPI:  HLD- Taking Vytorin every other day and Fish oil 2000 mg daily.    Lab Results  Component Value Date   CHOL 212 (H) 09/22/2017   HDL 39.60 09/22/2017   LDLCALC 153 (H) 09/22/2017   LDLDIRECT 231.5 06/12/2012   TRIG 96.0 09/22/2017   CHOLHDL 5 09/22/2017    Depression-  Currently taking Wellbutrin XL 150 mg daily, which he started taking in 08/2017.  Also referred him to psychotherapy at that time.  He feels dosage needs to be increased.  Last few weeks, has had increased anhedonia.  Not sure why.  No SI or HI.   Depression screen [facility] 2/9 09/20/2018 09/22/2017  Decreased Interest 1 3  Down, Depressed, Hopeless 1 3  PHQ - 2 Score 2 6  Altered sleeping 1 2  Tired, decreased energy 2 3  Change in appetite 0 2  Feeling bad or failure about yourself  1 2  Trouble concentrating 0 2  Moving slowly or fidgety/restless 0 0  Suicidal thoughts 0 2  PHQ-9 Score 6 19  Difficult doing work/chores Somewhat difficult Extremely dIfficult   GAD 7 : Generalized Anxiety Score 09/20/2018  Nervous, Anxious, on Edge 1  Control/stop worrying 1  Worry too much - different things 1  Trouble relaxing 1  Restless 0  Easily annoyed or irritable 1  Afraid - awful might happen 1  Total GAD 7 Score 6  Anxiety Difficulty Somewhat difficult       Current Outpatient Medications on File Prior to Visit  Medication Sig Dispense Refill  . aspirin (ASPIRIN LOW DOSE) 81 MG  tablet Take 81 mg by mouth daily.      . diazepam (VALIUM) 5 MG tablet Take 1 tablet by mouth as needed.    . ezetimibe-simvastatin (VYTORIN) 10-20 MG tablet TAKE 1 TABLET BY MOUTH EVERYDAY AT BEDTIME (Patient taking differently: 1qod) 90 tablet 1  . fexofenadine (ALLEGRA) 30 MG tablet Take 30 mg by mouth 2 (two) times daily.    . Omega-3 Fatty Acids (FISH OIL) 1000 MG CAPS Take 2 capsules by mouth daily.     Marland Kitchen triamterene-hydrochlorothiazide (DYAZIDE) 37.5-25 MG per capsule Take 1 capsule by mouth daily.     No current facility-administered medications on file prior to visit.     Allergies  Allergen Reactions  . Hydrocodone     Severe vomiting  . Morphine And Related     Caused nausea and vomiting    Past Medical History:  Diagnosis Date  . Anxiety   . Meniere disease   . Post-operative nausea and vomiting     Past Surgical History:  Procedure Laterality Date  . ENDOLYMPHATIC [facility] OPERATION  02/2011 and 2013   ENT - Dr. Joneen Roach  . MASTOIDECTOMY  2013  . SHOULDER SURGERY  04/2002   left    Family History  Problem Relation Age of Onset  .  Heart disease Father   . Cancer Father        prostate  . Heart disease Brother   . Colon cancer Paternal Grandmother   . Prostate cancer Paternal Grandfather     Social History   Socioeconomic History  . Marital status: Married    Spouse name: Not on file  . Number of children: 2  . Years of education: Not on file  . Highest education level: Not on file  Occupational History  . Occupation: Landscaper  Social Needs  . Financial resource strain: Not on file  . Food insecurity:    Worry: Not on file    Inability: Not on file  . Transportation needs:    Medical: Not on file    Non-medical: Not on file  Tobacco Use  . Smoking status: Never Smoker  . Smokeless tobacco: Never Used  Substance and Sexual Activity  . Alcohol use: No    Alcohol/week: 0.0 standard drinks  . Drug use: No  . Sexual activity: Not on file    Lifestyle  . Physical activity:    Days per week: Not on file    Minutes per session: Not on file  . Stress: Not on file  Relationships  . Social connections:    Talks on phone: Not on file    Gets together: Not on file    Attends religious service: Not on file    Active member of club or organization: Not on file    Attends meetings of clubs or organizations: Not on file    Relationship status: Not on file  . Intimate partner violence:    Fear of current or ex partner: Not on file    Emotionally abused: Not on file    Physically abused: Not on file    Forced sexual activity: Not on file  Other Topics Concern  . Not on file  Social History Narrative  . Not on file   The PMH, PSH, Social History, Family History, Medications, and allergies have been reviewed in [facility], and have been updated if relevant.   Review of Systems  Constitutional: Positive for fatigue.  Cardiovascular: Negative.   Gastrointestinal: Negative.   Musculoskeletal: Negative.   Skin: Negative.   Psychiatric/Behavioral: Positive for decreased concentration, dysphoric mood and sleep disturbance. Negative for agitation, behavioral problems, confusion, hallucinations, self-injury and suicidal ideas. The patient is not nervous/anxious and is not hyperactive.   All other systems reviewed and are negative.      Objective:    BP 140/86 (BP Location: Left Arm, Patient Position: Sitting, Cuff Size: Normal)   Pulse 85   Temp 98.4 F (36.9 C) (Oral)   Ht 6' (1.829 m)   Wt 242 lb 6.4 oz (110 kg)   SpO2 97%   BMI 32.88 kg/m    Physical Exam  Constitutional: He is oriented to person, place, and time. He appears well-developed and well-nourished. No distress.  HENT:  Head: Normocephalic and atraumatic.  Eyes: Conjunctivae are normal.  Neck: Normal range of motion.  Cardiovascular: Normal rate.  Pulmonary/Chest: Effort normal.  Musculoskeletal:        General: No edema.  Neurological: He is alert and  oriented to person, place, and time.  Skin: Skin is warm and dry. He is not diaphoretic.  Psychiatric: He has a normal mood and affect. His behavior is normal. Judgment and thought content normal.  Nursing note and vitals reviewed.         Assessment & Plan:  Depression, recurrent (HCC)  HYPERLIPIDEMIA - Plan: Lipid panel, CBC with Differential/Platelet, Comprehensive metabolic panel, TSH  Need for hepatitis C screening test - Plan: Hepatitis C Antibody  Screening for prostate cancer - Plan: PSA No follow-ups on file.

## 2018-09-20 ENCOUNTER — Ambulatory Visit: Payer: BC Managed Care – PPO | Admitting: Family Medicine

## 2018-09-20 ENCOUNTER — Encounter: Payer: Self-pay | Admitting: Family Medicine

## 2018-09-20 VITALS — BP 140/86 | HR 85 | Temp 98.4°F | Ht 72.0 in | Wt 242.4 lb

## 2018-09-20 DIAGNOSIS — E782 Mixed hyperlipidemia: Secondary | ICD-10-CM | POA: Diagnosis not present

## 2018-09-20 DIAGNOSIS — F339 Major depressive disorder, recurrent, unspecified: Secondary | ICD-10-CM

## 2018-09-20 DIAGNOSIS — Z1159 Encounter for screening for other viral diseases: Secondary | ICD-10-CM | POA: Diagnosis not present

## 2018-09-20 DIAGNOSIS — Z125 Encounter for screening for malignant neoplasm of prostate: Secondary | ICD-10-CM | POA: Diagnosis not present

## 2018-09-20 LAB — CBC WITH DIFFERENTIAL/PLATELET
Basophils Absolute: 0 10*3/uL (ref 0.0–0.1)
Basophils Relative: 0.7 % (ref 0.0–3.0)
EOS ABS: 0.2 10*3/uL (ref 0.0–0.7)
Eosinophils Relative: 3 % (ref 0.0–5.0)
HCT: 47.2 % (ref 39.0–52.0)
Hemoglobin: 16.5 g/dL (ref 13.0–17.0)
LYMPHS ABS: 2.9 10*3/uL (ref 0.7–4.0)
Lymphocytes Relative: 39.1 % (ref 12.0–46.0)
MCHC: 34.9 g/dL (ref 30.0–36.0)
MCV: 90 fl (ref 78.0–100.0)
MONO ABS: 0.8 10*3/uL (ref 0.1–1.0)
Monocytes Relative: 10.3 % (ref 3.0–12.0)
NEUTROS ABS: 3.5 10*3/uL (ref 1.4–7.7)
Neutrophils Relative %: 46.9 % (ref 43.0–77.0)
PLATELETS: 249 10*3/uL (ref 150.0–400.0)
RBC: 5.25 Mil/uL (ref 4.22–5.81)
RDW: 12.3 % (ref 11.5–15.5)
WBC: 7.4 10*3/uL (ref 4.0–10.5)

## 2018-09-20 LAB — COMPREHENSIVE METABOLIC PANEL
ALK PHOS: 81 U/L (ref 39–117)
ALT: 48 U/L (ref 0–53)
AST: 25 U/L (ref 0–37)
Albumin: 4.4 g/dL (ref 3.5–5.2)
BUN: 13 mg/dL (ref 6–23)
CALCIUM: 9.5 mg/dL (ref 8.4–10.5)
CO2: 28 meq/L (ref 19–32)
Chloride: 101 mEq/L (ref 96–112)
Creatinine, Ser: 0.9 mg/dL (ref 0.40–1.50)
GFR: 87.25 mL/min (ref >60.00)
GLUCOSE: 102 mg/dL — AB (ref 70–99)
POTASSIUM: 4.1 meq/L (ref 3.5–5.1)
Sodium: 138 mEq/L (ref 135–145)
TOTAL PROTEIN: 6.9 g/dL (ref 6.0–8.3)
Total Bilirubin: 0.6 mg/dL (ref 0.2–1.2)

## 2018-09-20 LAB — LIPID PANEL
CHOL/HDL RATIO: 5
Cholesterol: 211 mg/dL — ABNORMAL HIGH (ref 0–200)
HDL: 42.5 mg/dL (ref >39.00)
LDL CALC: 134 mg/dL — AB (ref 0–99)
NonHDL: 168.97
TRIGLYCERIDES: 177 mg/dL — AB (ref 0.0–149.0)
VLDL: 35.4 mg/dL (ref 0.0–40.0)

## 2018-09-20 MED ORDER — BUPROPION HCL ER (XL) 300 MG PO TB24: 300.0000 mg | ORAL_TABLET | Freq: Every day | ORAL | 3 refills | Status: DC

## 2018-09-20 NOTE — Patient Instructions (Addendum)
Great to see you. I will call you with your lab results from today and you can view them online.   We are increasing your Wellbutrin XL to 300 mg daily.  Please keep me updated.

## 2018-09-20 NOTE — Assessment & Plan Note (Addendum)
Improved but feels he is still symptomatic. Will increase Wellbutrin to 300 mg XL daily. He will update me in a few weeks.  >25 minutes spent in face to face time with patient, >50% spent in counselling or coordination of care discussed anxiety, depression, and HLD.

## 2018-09-20 NOTE — Assessment & Plan Note (Signed)
Due for labs today. Continue current rxs for now.

## 2018-09-21 LAB — PSA: PSA: 0.64 ng/mL (ref 0.10–4.00)

## 2018-09-21 LAB — TSH: TSH: 2.24 u[IU]/mL (ref 0.35–4.50)

## 2018-09-21 LAB — HEPATITIS C ANTIBODY
HEP C AB: NONREACTIVE
SIGNAL TO CUT-OFF: 0.01 (ref <1.00)

## 2018-09-25 ENCOUNTER — Telehealth: Payer: Self-pay

## 2018-09-25 NOTE — Telephone Encounter (Signed)
Copied from CRM [id number]. Topic: Quick Communication - Rx Refill/Question >> Sep 25, 2018 10:36 AM Eric Pierce E wrote: Medication: buPROPion (WELLBUTRIN XL) 300 MG 24 hr tablet - Pt wife called in and stated that the Pt started taking this on Friday and has had migraine like headaches since staring the increased dosage. Pt wants to know if he can take half in the morning and half at night or will the headaches stop soon after being on the med for a while/ please advise

## 2018-09-26 NOTE — Telephone Encounter (Signed)
Left pt VM to call back to inform pt that he can take half of his wellbutrin in the morning and half at night to see if that helps out with his headaches.

## 2018-09-26 NOTE — Telephone Encounter (Signed)
We can certainly try that. Please keep me updated.

## 2018-09-26 NOTE — Telephone Encounter (Signed)
Dr. Dayton Martes please advise pt started on wellbutrin 2/28 has had mirgraine since starting pt wants to know if he can take half in morning and half at night or if taking it for a while will cause headaches to go away.

## 2018-10-04 ENCOUNTER — Other Ambulatory Visit: Payer: Self-pay | Admitting: Family Medicine

## 2018-12-14 ENCOUNTER — Telehealth: Payer: Self-pay

## 2018-12-14 NOTE — Telephone Encounter (Signed)
TA-Pt's wife called stating that he woke up with left eye seeled shut/feels that it is conjunctivitis/plz advise/thx dmf

## 2018-12-14 NOTE — Telephone Encounter (Signed)
Copied from CRM [id number]. Topic: General - Other >> Dec 14, 2018  8:17 AM Jaquita Rector A wrote: Reason for CRM: Patients wife called to say that he woke up with the left eye shut. She think it is conjunctivitis would like to know if there is something Dr Dayton Martes can prescribe. Please advise Ph#  [phone]

## 2018-12-14 NOTE — Telephone Encounter (Signed)
Can he do a mychart visit with Dr. Salena Saner on Friday?

## 2018-12-14 NOTE — Telephone Encounter (Signed)
Please see below.

## 2018-12-14 NOTE — Telephone Encounter (Signed)
Pt wife calling to check on the status of the previous request for a medication for the Pt' s eye.

## 2018-12-15 NOTE — Telephone Encounter (Signed)
Left pt VM to call to set up appt with Dr. Salena Saner concerning issue below.

## 2019-02-07 ENCOUNTER — Other Ambulatory Visit: Payer: Self-pay | Admitting: Family Medicine

## 2019-02-22 ENCOUNTER — Telehealth: Payer: Self-pay | Admitting: Family Medicine

## 2019-02-22 NOTE — Telephone Encounter (Signed)
Okay to refill? 

## 2019-02-22 NOTE — Telephone Encounter (Signed)
Copied from [place] [id number]. Topic: Quick Communication - Rx Refill/Question >> Feb 22, 2019  1:18 PM Nils Flack wrote: Medication: montelukast  10 mg   Has the patient contacted their pharmacy? No. (Agent: If no, request that the patient contact the pharmacy for the refill.) (Agent: If yes, when and what did the pharmacy advise?)  Preferred Pharmacy (with phone number or street name): [facility] [email or web address]  Pt's ent has closed his practice and is not at his new location until September.  Pt needs this filled until after September   Agent: Please be advised that RX refills may take up to 3 business days. We ask that you follow-up with your pharmacy.

## 2019-02-26 ENCOUNTER — Other Ambulatory Visit: Payer: Self-pay | Admitting: Family Medicine

## 2019-02-26 NOTE — Telephone Encounter (Signed)
Not on pt's current med list/thx dmf

## 2019-02-27 MED ORDER — MONTELUKAST SODIUM 10 MG PO TABS: 10.0000 mg | ORAL_TABLET | Freq: Every day | ORAL | 0 refills | Status: DC

## 2019-02-27 NOTE — Telephone Encounter (Signed)
Ok to fill x 30 days.

## 2019-02-27 NOTE — Telephone Encounter (Signed)
Sent in #30 of Singulair per CM in TA's absence/thx dmf

## 2019-02-27 NOTE — Telephone Encounter (Signed)
TA-The last time Montelukast was filled was 2016/Plz advise if it is ok to send in a 30d supply of this in Dr. Hulen Shouts absence/thx dmf

## 2019-02-27 NOTE — Telephone Encounter (Signed)
Pt's wife is wanting to know the status of this medication being approved at the pharmacy    [facility].

## 2019-03-21 ENCOUNTER — Other Ambulatory Visit: Payer: Self-pay | Admitting: Family Medicine

## 2019-03-21 NOTE — Telephone Encounter (Signed)
Dr. Deborra Medina please advise pt was given 30 day supply from Dr. Zigmund Daniel

## 2019-04-21 ENCOUNTER — Other Ambulatory Visit: Payer: Self-pay | Admitting: Family Medicine

## 2019-05-04 ENCOUNTER — Other Ambulatory Visit: Payer: Self-pay | Admitting: Family Medicine

## 2019-05-19 ENCOUNTER — Other Ambulatory Visit: Payer: Self-pay | Admitting: Family Medicine

## 2019-05-29 DIAGNOSIS — H8102 Meniere's disease, left ear: Secondary | ICD-10-CM | POA: Insufficient documentation

## 2019-05-29 DIAGNOSIS — H9042 Sensorineural hearing loss, unilateral, left ear, with unrestricted hearing on the contralateral side: Secondary | ICD-10-CM | POA: Insufficient documentation

## 2019-06-01 DIAGNOSIS — H9312 Tinnitus, left ear: Secondary | ICD-10-CM | POA: Insufficient documentation

## 2019-06-21 ENCOUNTER — Other Ambulatory Visit: Payer: Self-pay | Admitting: Family Medicine

## 2019-07-12 ENCOUNTER — Other Ambulatory Visit: Payer: Self-pay | Admitting: Family Medicine

## 2019-07-24 ENCOUNTER — Other Ambulatory Visit: Payer: Self-pay | Admitting: Family Medicine

## 2019-07-30 ENCOUNTER — Other Ambulatory Visit: Payer: Self-pay | Admitting: Family Medicine

## 2019-07-31 ENCOUNTER — Telehealth: Payer: Self-pay | Admitting: Family Medicine

## 2019-07-31 NOTE — Telephone Encounter (Signed)
Pt refill for wellbutrin was denied due to last OV was in Feb. Pt scheduled appt on Thursday mychart video visit. Pt states he cannot be without this medication and want to know if possible [facility] and will do visit on Thursday?

## 2019-07-31 NOTE — Telephone Encounter (Signed)
Last OV 09/20/18 Last fill 06/25/19  #30/0 Pt need a follow up virtual visit before any refills.

## 2019-08-01 MED ORDER — BUPROPION HCL ER (XL) 300 MG PO TB24: ORAL_TABLET | ORAL | 0 refills | Status: DC

## 2019-08-01 NOTE — Telephone Encounter (Signed)
LMOV for pt to make aware refill was sent to pharmacy.

## 2019-08-01 NOTE — Progress Notes (Signed)
Virtual Visit via Video   Due to the COVID-19 pandemic, this visit was completed with telemedicine (audio/video) technology to reduce patient and provider exposure as well as to preserve personal protective equipment.   I connected with Eric Pierce by a video enabled telemedicine application and verified that I am speaking with the correct person using two identifiers. Location patient: Home Location provider: [facility], Office Persons participating in the virtual visit: Valerie Roys, MD   I discussed the limitations of evaluation and management by telemedicine and the availability of in person appointments. The patient expressed understanding and agreed to proceed.  Interactive audio and video telecommunications were attempted between this provider and patient, however failed, due to patient having technical difficulties OR patient did not have access to video capability.  We continued and completed visit with audio only.   Care Team   Patient Care Team: Dianne Dun, MD as PCP - General  Subjective:   HPI:  Follow up depression.  Review of Systems  Constitutional: Negative.  Negative for fever and malaise/fatigue.  HENT: Negative.  Negative for congestion and hearing loss.   Eyes: Negative.  Negative for blurred vision, discharge and redness.  Respiratory: Negative for cough and shortness of breath.   Cardiovascular: Negative.  Negative for chest pain, palpitations and leg swelling.  Gastrointestinal: Negative.  Negative for abdominal pain and heartburn.  Genitourinary: Negative.  Negative for dysuria.  Musculoskeletal: Negative.  Negative for falls.  Skin: Negative.  Negative for rash.  Neurological: Negative.  Negative for loss of consciousness and headaches.  Endo/Heme/Allergies: Negative.  Does not bruise/bleed easily.  Psychiatric/Behavioral: Negative.  Negative for depression.  All other systems reviewed and are negative.    Patient Active  Problem List   Diagnosis Date Noted  . Elevated blood pressure (not hypertension) 11/04/2014  . Special screening for malignant neoplasms, colon 11/15/2013  . Meniere disease 04/19/2011  . BACK PAIN 08/23/2007  . TRANSAMINASES, SERUM, ELEVATED 08/23/2007  . HYPERLIPIDEMIA 08/17/2007  . ANXIETY STATE NOS 11/01/2006  . 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 Depression, recurrent (HCC) 02/23/2006    Social History   Tobacco Use  . Smoking status: Never Smoker  . Smokeless tobacco: Never Used  Substance Use Topics  . Alcohol use: No    Alcohol/week: 0.0 standard drinks    Current Outpatient Medications:  .  aspirin (ASPIRIN LOW DOSE) 81 MG tablet, Take 81 mg by mouth daily.  , Disp: , Rfl:  .  buPROPion (WELLBUTRIN XL) 300 MG 24 hr tablet, Take 1qd (Plz schedule virtual visit for future fills), Disp: 30 tablet, Rfl: 0 .  diazepam (VALIUM) 5 MG tablet, Take 1 tablet by mouth as needed., Disp: , Rfl:  .  ezetimibe-simvastatin (VYTORIN) 10-20 MG tablet, TAKE 1 TABLET BY MOUTH EVERYDAY AT BEDTIME, Disp: 90 tablet, Rfl: 0 .  fexofenadine (ALLEGRA) 30 MG tablet, Take 30 mg by mouth 2 (two) times daily., Disp: , Rfl:  .  montelukast (SINGULAIR) 10 MG tablet, TAKE 1 TABLET BY MOUTH EVERYDAY AT BEDTIME, Disp: 90 tablet, Rfl: 1 .  Omega-3 Fatty Acids (FISH OIL) 1000 MG CAPS, Take 2 capsules by mouth daily. , Disp: , Rfl:  .  triamterene-hydrochlorothiazide (DYAZIDE) 37.5-25 MG per capsule, Take 1 capsule by mouth daily., Disp: , Rfl:   Allergies  Allergen Reactions  . Hydrocodone     Severe vomiting  . Morphine And Related     Caused nausea and vomiting  Objective:   VITALS: Per patient if applicable, see vitals. GENERAL: Alert, appears well and in no acute distress. HEENT: Atraumatic, conjunctiva clear, no obvious abnormalities on inspection of external nose and ears. NECK: Normal movements of the head and neck. CARDIOPULMONARY: No increased WOB. Speaking in  clear sentences. I:E ratio WNL.  MS: Moves all visible extremities without noticeable abnormality. PSYCH: Pleasant and cooperative, well-groomed. Speech normal rate and rhythm. Affect is appropriate. Insight and judgement are appropriate. Attention is focused, linear, and appropriate.  NEURO: CN grossly intact. Oriented as arrived to appointment on time with no prompting. Moves both UE equally.  SKIN: No obvious lesions, wounds, erythema, or cyanosis noted on face or hands.  Depression screen [facility] 2/9 09/20/2018 09/22/2017  Decreased Interest 1 3  Down, Depressed, Hopeless 1 3  PHQ - 2 Score 2 6  Altered sleeping 1 2  Tired, decreased energy 2 3  Change in appetite 0 2  Feeling bad or failure about yourself  1 2  Trouble concentrating 0 2  Moving slowly or fidgety/restless 0 0  Suicidal thoughts 0 2  PHQ-9 Score 6 19  Difficult doing work/chores Somewhat difficult Extremely dIfficult     . COVID-19 Education: The signs and symptoms of COVID-19 were discussed with the patient and how to seek care for testing if needed. The importance of social distancing was discussed today. . Reviewed expectations re: course of current medical issues. . Discussed self-management of symptoms. . Outlined signs and symptoms indicating need for more acute intervention. . Patient verbalized understanding and all questions were answered. Marland Kitchen Health Maintenance issues including appropriate healthy diet, exercise, and smoking avoidance were discussed with patient. . See orders for this visit as documented in the electronic medical record.  Arnette Norris, MD  Records requested if needed. Time spent: 15 minutes, of which >50% was spent in obtaining information about his symptoms, reviewing his previous labs, evaluations, and treatments, counseling him about his condition (please see the discussed topics above), and developing a plan to further investigate it; he had a number of questions which I addressed.   Lab  Results  Component Value Date   WBC 7.4 09/20/2018   HGB 16.5 09/20/2018   HCT 47.2 09/20/2018   PLT 249.0 09/20/2018   GLUCOSE 102 (H) 09/20/2018   CHOL 211 (H) 09/20/2018   TRIG 177.0 (H) 09/20/2018   HDL 42.50 09/20/2018   LDLDIRECT 231.5 06/12/2012   LDLCALC 134 (H) 09/20/2018   ALT 48 09/20/2018   AST 25 09/20/2018   NA 138 09/20/2018   K 4.1 09/20/2018   CL 101 09/20/2018   CREATININE 0.90 09/20/2018   BUN 13 09/20/2018   CO2 28 09/20/2018   TSH 2.24 09/20/2018   PSA 0.64 09/20/2018   INR 1.06 06/19/2009   HGBA1C 5.7 08/07/2012    Lab Results  Component Value Date   TSH 2.24 09/20/2018   Lab Results  Component Value Date   WBC 7.4 09/20/2018   HGB 16.5 09/20/2018   HCT 47.2 09/20/2018   MCV 90.0 09/20/2018   PLT 249.0 09/20/2018   Lab Results  Component Value Date   NA 138 09/20/2018   K 4.1 09/20/2018   CO2 28 09/20/2018   GLUCOSE 102 (H) 09/20/2018   BUN 13 09/20/2018   CREATININE 0.90 09/20/2018   BILITOT 0.6 09/20/2018   ALKPHOS 81 09/20/2018   AST 25 09/20/2018   ALT 48 09/20/2018   PROT 6.9 09/20/2018   ALBUMIN 4.4 09/20/2018   CALCIUM  9.5 09/20/2018   GFR 87.25 09/20/2018   Lab Results  Component Value Date   CHOL 211 (H) 09/20/2018   Lab Results  Component Value Date   HDL 42.50 09/20/2018   Lab Results  Component Value Date   LDLCALC 134 (H) 09/20/2018   Lab Results  Component Value Date   TRIG 177.0 (H) 09/20/2018   Lab Results  Component Value Date   CHOLHDL 5 09/20/2018   Lab Results  Component Value Date   HGBA1C 5.7 08/07/2012       Assessment & Plan:   Problem List Items Addressed This Visit    None      I am having Eric Pierce maintain his aspirin, Fish Oil, triamterene-hydrochlorothiazide, diazepam, fexofenadine, montelukast, ezetimibe-simvastatin, and buPROPion.  No orders of the defined types were placed in this encounter.    Ruthe Mannan, MD

## 2019-08-01 NOTE — Telephone Encounter (Signed)
Yes absolutely!!  eRx sent to pharmacy on file.

## 2019-08-01 NOTE — Addendum Note (Signed)
Addended by: Dianne Dun on: 08/01/2019 11:17 AM   Modules accepted: Orders

## 2019-08-02 ENCOUNTER — Telehealth (INDEPENDENT_AMBULATORY_CARE_PROVIDER_SITE_OTHER): Payer: BC Managed Care – PPO | Admitting: Family Medicine

## 2019-08-02 DIAGNOSIS — F339 Major depressive disorder, recurrent, unspecified: Secondary | ICD-10-CM | POA: Diagnosis not present

## 2019-08-02 MED ORDER — BUPROPION HCL ER (XL) 300 MG PO TB24: ORAL_TABLET | ORAL | 3 refills | Status: DC

## 2019-08-02 NOTE — Assessment & Plan Note (Signed)
History: Current symptoms include difficulty concentrating. Symptoms have been unchanged since that time. Patient denies anhedonia, depressed mood, difficulty concentrating, fatigue, feelings of worthlessness/guilt, hopelessness, hypersomnia, impaired memory, insomnia, psychomotor agitation, psychomotor retardation, recurrent thoughts of death, suicidal attempt, suicidal thoughts with specific plan, suicidal thoughts without plan, weight gain and weight loss.Marland Kitchen He complains of the following side effects from the treatment: none. Assessment/Plan:      1. Medications: Wellbutrin 300 mg XL daily. 2. Instructed patient to contact office or on-call physician promptly should condition worsen or any new symptoms appear. IF THE PATIENT HAS ANY SUICIDAL OR HOMICIDAL IDEATIONS, CALL THE OFFICE, DISCUSS WITH A SUPPORT MEMBER, OR GO TO THE ER IMMEDIATELY. Patient was agreeable with this plan.

## 2019-12-25 ENCOUNTER — Other Ambulatory Visit: Payer: Self-pay

## 2019-12-25 MED ORDER — MONTELUKAST SODIUM 10 MG PO TABS: ORAL_TABLET | ORAL | 0 refills | Status: DC

## 2020-01-09 ENCOUNTER — Telehealth: Payer: Self-pay | Admitting: Family Medicine

## 2020-01-09 NOTE — Telephone Encounter (Signed)
Tried to call pt to see what he wants to do since Dr Dayton Martes is no longer here, Lvm for pt to call back

## 2020-01-11 ENCOUNTER — Other Ambulatory Visit: Payer: Self-pay

## 2020-01-11 ENCOUNTER — Encounter: Payer: Self-pay | Admitting: Internal Medicine

## 2020-01-11 ENCOUNTER — Ambulatory Visit: Payer: BC Managed Care – PPO | Admitting: Internal Medicine

## 2020-01-11 VITALS — BP 144/86 | HR 96 | Temp 97.3°F | Wt 240.0 lb

## 2020-01-11 DIAGNOSIS — L089 Local infection of the skin and subcutaneous tissue, unspecified: Secondary | ICD-10-CM | POA: Diagnosis not present

## 2020-01-11 DIAGNOSIS — L723 Sebaceous cyst: Secondary | ICD-10-CM | POA: Diagnosis not present

## 2020-01-11 MED ORDER — SULFAMETHOXAZOLE-TRIMETHOPRIM 800-160 MG PO TABS: 1.0000 | ORAL_TABLET | Freq: Two times a day (BID) | ORAL | 0 refills | Status: DC

## 2020-01-11 NOTE — Progress Notes (Signed)
Subjective:    Patient ID: Eric Pierce, male    DOB: [birth year], 59 y.o.   MRN: [id number]  HPI  Patient presents the clinic today with complaint of bump between his shoulder blades.  He noticed this 1 year ago but reports it has become tender to touch in the last week. He has not noticed any redness, warmth or discharge from the area. He denies fever, chills or body aches.  Review of Systems      Past Medical History:  Diagnosis Date  . Anxiety   . Meniere disease   . Post-operative nausea and vomiting     Current Outpatient Medications  Medication Sig Dispense Refill  . aspirin (ASPIRIN LOW DOSE) 81 MG tablet Take 81 mg by mouth daily.      Marland Kitchen buPROPion (WELLBUTRIN XL) 300 MG 24 hr tablet Take 1qd (Plz schedule virtual visit for future fills) 90 tablet 3  . diazepam (VALIUM) 5 MG tablet Take 1 tablet by mouth as needed.    . ezetimibe-simvastatin (VYTORIN) 10-20 MG tablet TAKE 1 TABLET BY MOUTH EVERYDAY AT BEDTIME 90 tablet 0  . fexofenadine (ALLEGRA) 30 MG tablet Take 30 mg by mouth 2 (two) times daily.    . montelukast (SINGULAIR) 10 MG tablet Take 1qd (Plz sched appt with new provider for future fills) 90 tablet 0  . Omega-3 Fatty Acids (FISH OIL) 1000 MG CAPS Take 2 capsules by mouth daily.     Marland Kitchen triamterene-hydrochlorothiazide (DYAZIDE) 37.5-25 MG per capsule Take 1 capsule by mouth daily.     No current facility-administered medications for this visit.    Allergies  Allergen Reactions  . Hydrocodone     Severe vomiting  . Morphine And Related     Caused nausea and vomiting    Family History  Problem Relation Age of Onset  . Heart disease Father   . Cancer Father        prostate  . Heart disease Brother   . Colon cancer Paternal Grandmother   . Prostate cancer Paternal Grandfather     Social History   Socioeconomic History  . Marital status: Married    Spouse name: Not on file  . Number of children: 2  . Years of education: Not on file  . Highest  education level: Not on file  Occupational History  . Occupation: Landscaper  Tobacco Use  . Smoking status: Never Smoker  . Smokeless tobacco: Never Used  Substance and Sexual Activity  . Alcohol use: No    Alcohol/week: 0.0 standard drinks  . Drug use: No  . Sexual activity: Not on file  Other Topics Concern  . Not on file  Social History Narrative  . Not on file   Social Determinants of Health   Financial Resource Strain:   . Difficulty of Paying Living Expenses:   Food Insecurity:   . Worried About Charity fundraiser in the Last Year:   . Arboriculturist in the Last Year:   Transportation Needs:   . Film/video editor (Medical):   Marland Kitchen Lack of Transportation (Non-Medical):   Physical Activity:   . Days of Exercise per Week:   . Minutes of Exercise per Session:   Stress:   . Feeling of Stress :   Social Connections:   . Frequency of Communication with Friends and Family:   . Frequency of Social Gatherings with Friends and Family:   . Attends Religious Services:   . Active Member of  Clubs or Organizations:   . Attends Archivist Meetings:   Marland Kitchen Marital Status:   Intimate Partner Violence:   . Fear of Current or Ex-Partner:   . Emotionally Abused:   Marland Kitchen Physically Abused:   . Sexually Abused:      Constitutional: Denies fever, malaise, fatigue, headache or abrupt weight changes.  Respiratory: Denies difficulty breathing, shortness of breath, cough or sputum production.   Cardiovascular: Denies chest pain, chest tightness, palpitations or swelling in the hands or feet.  Skin: Pt reports lesion of upper back between shoulder blades. Denies ulcercations.    No other specific complaints in a complete review of systems (except as listed in HPI above).  Objective:   Physical Exam BP (!) 144/86   Pulse 96   Temp (!) 97.3 F (36.3 C) (Temporal)   Wt 240 lb (108.9 kg)   SpO2 98%   BMI 32.55 kg/m   Wt Readings from Last 3 Encounters:  09/20/18 242 lb  6.4 oz (110 kg)  09/22/17 246 lb 6.4 oz (111.8 kg)  04/14/17 240 lb (108.9 kg)    General: Appears his stated age, obese in NAD. Skin: 3 cm x 3 cm nonfluctuant, hard infected sebaceous cyst noted of upper back. Cardiovascular: Normal rate. Pulmonary/Chest: Normal effort. Neurological: Alert and oriented.   Psychiatric: Mood and affect normal. Behavior is normal. Judgment and thought content normal.    BMET    Component Value Date/Time   NA 138 09/20/2018 1037   K 4.1 09/20/2018 1037   CL 101 09/20/2018 1037   CO2 28 09/20/2018 1037   GLUCOSE 102 (H) 09/20/2018 1037   BUN 13 09/20/2018 1037   CREATININE 0.90 09/20/2018 1037   CALCIUM 9.5 09/20/2018 1037   GFRNONAA >60 06/21/2009 0525   GFRAA  06/21/2009 0525    >60        The eGFR has been calculated using the MDRD equation. This calculation has not been validated in all clinical situations. eGFR's persistently <60 mL/min signify possible Chronic Kidney Disease.    Lipid Panel     Component Value Date/Time   CHOL 211 (H) 09/20/2018 1037   TRIG 177.0 (H) 09/20/2018 1037   HDL 42.50 09/20/2018 1037   CHOLHDL 5 09/20/2018 1037   VLDL 35.4 09/20/2018 1037   LDLCALC 134 (H) 09/20/2018 1037    CBC    Component Value Date/Time   WBC 7.4 09/20/2018 1037   RBC 5.25 09/20/2018 1037   HGB 16.5 09/20/2018 1037   HCT 47.2 09/20/2018 1037   PLT 249.0 09/20/2018 1037   MCV 90.0 09/20/2018 1037   MCHC 34.9 09/20/2018 1037   RDW 12.3 09/20/2018 1037   LYMPHSABS 2.9 09/20/2018 1037   MONOABS 0.8 09/20/2018 1037   EOSABS 0.2 09/20/2018 1037   BASOSABS 0.0 09/20/2018 1037    Hgb A1C Lab Results  Component Value Date   HGBA1C 5.7 08/07/2012             Assessment & Plan:   Infected Sebaceous Cyst:  RX for Bactrim DS 1 tab PO BID x 10 days Unable to I&D today Encouraged warm compresses for 10 minutes 3 x day  Return precautions discussed Webb Silversmith, NP This visit occurred during the SARS-CoV-2 public  health emergency.  Safety protocols were in place, including screening questions prior to the visit, additional usage of staff PPE, and extensive cleaning of exam room while observing appropriate contact time as indicated for disinfecting solutions.

## 2020-01-11 NOTE — Patient Instructions (Signed)

## 2020-01-14 ENCOUNTER — Ambulatory Visit: Payer: BC Managed Care – PPO | Admitting: Internal Medicine

## 2020-02-21 ENCOUNTER — Ambulatory Visit: Payer: BC Managed Care – PPO | Admitting: Internal Medicine

## 2020-02-21 ENCOUNTER — Other Ambulatory Visit: Payer: Self-pay

## 2020-02-21 ENCOUNTER — Encounter: Payer: Self-pay | Admitting: Internal Medicine

## 2020-02-21 DIAGNOSIS — F339 Major depressive disorder, recurrent, unspecified: Secondary | ICD-10-CM

## 2020-02-21 DIAGNOSIS — H8103 Meniere's disease, bilateral: Secondary | ICD-10-CM | POA: Diagnosis not present

## 2020-02-21 DIAGNOSIS — F411 Generalized anxiety disorder: Secondary | ICD-10-CM | POA: Diagnosis not present

## 2020-02-21 DIAGNOSIS — E782 Mixed hyperlipidemia: Secondary | ICD-10-CM

## 2020-02-21 DIAGNOSIS — I10 Essential (primary) hypertension: Secondary | ICD-10-CM | POA: Diagnosis not present

## 2020-02-21 MED ORDER — LISINOPRIL 10 MG PO TABS: 10.0000 mg | ORAL_TABLET | Freq: Every day | ORAL | 0 refills | Status: DC

## 2020-02-21 NOTE — Assessment & Plan Note (Signed)
Continue Triamterene HCT Will monitor

## 2020-02-21 NOTE — Assessment & Plan Note (Signed)
Continue Triamterene HCT RX for Lisinopril 10 mg daily  RTC in 2 weeks for CPE, BP check

## 2020-02-21 NOTE — Assessment & Plan Note (Signed)
CMET and Lipid profile today Continue Simvastatin-Ezetimibe and Fish Oil as prescribed Encouraged low fat diet

## 2020-02-21 NOTE — Patient Instructions (Signed)
Fat and Cholesterol Restricted Eating Plan Getting too much fat and cholesterol in your diet may cause health problems. Choosing the right foods helps keep your fat and cholesterol at normal levels. This can keep you from getting certain diseases. Your doctor may recommend an eating plan that includes:  Total fat: ______% or less of total calories a day.  Saturated fat: ______% or less of total calories a day.  Cholesterol: less than _________mg a day.  Fiber: ______g a day. What are tips for following this plan? Meal planning  At meals, divide your plate into four equal parts: ? Fill one-half of your plate with vegetables and green salads. ? Fill one-fourth of your plate with whole grains. ? Fill one-fourth of your plate with low-fat (lean) protein foods.  Eat fish that is high in omega-3 fats at least two times a week. This includes mackerel, tuna, sardines, and salmon.  Eat foods that are high in fiber, such as whole grains, beans, apples, broccoli, carrots, peas, and barley. General tips   Work with your doctor to lose weight if you need to.  Avoid: ? Foods with added sugar. ? Fried foods. ? Foods with partially hydrogenated oils.  Limit alcohol intake to no more than 1 drink a day for nonpregnant women and 2 drinks a day for men. One drink equals 12 oz of beer, 5 oz of wine, or 1 oz of hard liquor. Reading food labels  Check food labels for: ? Trans fats. ? Partially hydrogenated oils. ? Saturated fat (g) in each serving. ? Cholesterol (mg) in each serving. ? Fiber (g) in each serving.  Choose foods with healthy fats, such as: ? Monounsaturated fats. ? Polyunsaturated fats. ? Omega-3 fats.  Choose grain products that have whole grains. Look for the word "whole" as the first word in the ingredient list. Cooking  Cook foods using low-fat methods. These include baking, boiling, grilling, and broiling.  Eat more home-cooked foods. Eat at restaurants and buffets  less often.  Avoid cooking using saturated fats, such as butter, cream, palm oil, palm kernel oil, and coconut oil. Recommended foods  Fruits  All fresh, canned (in natural juice), or frozen fruits. Vegetables  Fresh or frozen vegetables (raw, steamed, roasted, or grilled). Green salads. Grains  Whole grains, such as whole wheat or whole grain breads, crackers, cereals, and pasta. Unsweetened oatmeal, bulgur, barley, quinoa, or brown rice. Corn or whole wheat flour tortillas. Meats and other protein foods  Ground beef (85% or leaner), grass-fed beef, or beef trimmed of fat. Skinless chicken or turkey. Ground chicken or turkey. Pork trimmed of fat. All fish and seafood. Egg whites. Dried beans, peas, or lentils. Unsalted nuts or seeds. Unsalted canned beans. Nut butters without added sugar or oil. Dairy  Low-fat or nonfat dairy products, such as skim or 1% milk, 2% or reduced-fat cheeses, low-fat and fat-free ricotta or cottage cheese, or plain low-fat and nonfat yogurt. Fats and oils  Tub margarine without trans fats. Light or reduced-fat mayonnaise and salad dressings. Avocado. Olive, canola, sesame, or safflower oils. The items listed above may not be a complete list of foods and beverages you can eat. Contact a dietitian for more information. Foods to avoid Fruits  Canned fruit in heavy syrup. Fruit in cream or butter sauce. Fried fruit. Vegetables  Vegetables cooked in cheese, cream, or butter sauce. Fried vegetables. Grains  White bread. White pasta. White rice. Cornbread. Bagels, pastries, and croissants. Crackers and snack foods that contain trans fat   and hydrogenated oils. Meats and other protein foods  Fatty cuts of meat. Ribs, chicken wings, bacon, sausage, bologna, salami, chitterlings, fatback, hot dogs, bratwurst, and packaged lunch meats. Liver and organ meats. Whole eggs and egg yolks. Chicken and turkey with skin. Fried meat. Dairy  Whole or 2% milk, cream,  half-and-half, and cream cheese. Whole milk cheeses. Whole-fat or sweetened yogurt. Full-fat cheeses. Nondairy creamers and whipped toppings. Processed cheese, cheese spreads, and cheese curds. Beverages  Alcohol. Sugar-sweetened drinks such as sodas, lemonade, and fruit drinks. Fats and oils  Butter, stick margarine, lard, shortening, ghee, or bacon fat. Coconut, palm kernel, and palm oils. Sweets and desserts  Corn syrup, sugars, honey, and molasses. Candy. Jam and jelly. Syrup. Sweetened cereals. Cookies, pies, cakes, donuts, muffins, and ice cream. The items listed above may not be a complete list of foods and beverages you should avoid. Contact a dietitian for more information. Summary  Choosing the right foods helps keep your fat and cholesterol at normal levels. This can keep you from getting certain diseases.  At meals, fill one-half of your plate with vegetables and green salads.  Eat high-fiber foods, like whole grains, beans, apples, carrots, peas, and barley.  Limit added sugar, saturated fats, alcohol, and fried foods. This information is not intended to replace advice given to you by your health care provider. Make sure you discuss any questions you have with your health care provider. Document Revised: 03/15/2018 Document Reviewed: 03/29/2017 Elsevier Patient Education  2020 [employer]  

## 2020-02-21 NOTE — Assessment & Plan Note (Signed)
Continue Wellbutrin and PRN Valium Support offered

## 2020-02-21 NOTE — Progress Notes (Signed)
HPI  Pt presents to the clinic today to establish care and for management of the conditions listed below. He is transferring care from Dr. Deborra Medina.  Anxiety: Triggered by general life stress. He takes Wellbutrin daily and Valium as needed. He is not currently seeing a therapist. He denies depression, SI/HI.  Meniere's Disease: Managed on Triamterene HCT. He follows with ENT.  HLD: His last LDL, triglycerides was. He is taking Simvastatin-Ezetimibe and Fish Oil as prescribed. He tries to consume a low fat diet.  HTN: His BP today is 142/96. He has never been diagnosed with HTN but reports his blood pressure has been this elevated for 10 years . He is Triamterene HCT as prescribed.   Flu: 03/2019 Tetanus: 02/2016 Covid: never PSA Screening: 08/2018 Colon Screening: 05/2015, 10 years  Vision Screening: as needed Dentist: annually  Past Medical History:  Diagnosis Date  . Anxiety   . Meniere disease   . Post-operative nausea and vomiting     Current Outpatient Medications  Medication Sig Dispense Refill  . aspirin (ASPIRIN LOW DOSE) 81 MG tablet Take 81 mg by mouth daily.      Marland Kitchen buPROPion (WELLBUTRIN XL) 300 MG 24 hr tablet Take 1qd (Plz schedule virtual visit for future fills) 90 tablet 3  . diazepam (VALIUM) 5 MG tablet Take 1 tablet by mouth as needed.    . ezetimibe-simvastatin (VYTORIN) 10-20 MG tablet TAKE 1 TABLET BY MOUTH EVERYDAY AT BEDTIME 90 tablet 0  . Omega-3 Fatty Acids (FISH OIL) 1000 MG CAPS Take 2 capsules by mouth daily.     Marland Kitchen sulfamethoxazole-trimethoprim (BACTRIM DS) 800-160 MG tablet Take 1 tablet by mouth 2 (two) times daily. 20 tablet 0  . triamterene-hydrochlorothiazide (DYAZIDE) 37.5-25 MG per capsule Take 1 capsule by mouth daily.     No current facility-administered medications for this visit.    Allergies  Allergen Reactions  . Hydrocodone     Severe vomiting  . Morphine And Related     Caused nausea and vomiting    Family History  Problem Relation  Age of Onset  . Heart disease Father   . Cancer Father        prostate  . Heart disease Brother   . Colon cancer Paternal Grandmother   . Prostate cancer Paternal Grandfather     Social History   Socioeconomic History  . Marital status: Married    Spouse name: Not on file  . Number of children: 2  . Years of education: Not on file  . Highest education level: Not on file  Occupational History  . Occupation: Landscaper  Tobacco Use  . Smoking status: Never Smoker  . Smokeless tobacco: Never Used  Substance and Sexual Activity  . Alcohol use: No    Alcohol/week: 0.0 standard drinks  . Drug use: No  . Sexual activity: Not on file  Other Topics Concern  . Not on file  Social History Narrative  . Not on file   Social Determinants of Health   Financial Resource Strain:   . Difficulty of Paying Living Expenses:   Food Insecurity:   . Worried About Charity fundraiser in the Last Year:   . Arboriculturist in the Last Year:   Transportation Needs:   . Film/video editor (Medical):   Marland Kitchen Lack of Transportation (Non-Medical):   Physical Activity:   . Days of Exercise per Week:   . Minutes of Exercise per Session:   Stress:   . Feeling  of Stress :   Social Connections:   . Frequency of Communication with Friends and Family:   . Frequency of Social Gatherings with Friends and Family:   . Attends Religious Services:   . Active Member of Clubs or Organizations:   . Attends Archivist Meetings:   Marland Kitchen Marital Status:   Intimate Partner Violence:   . Fear of Current or Ex-Partner:   . Emotionally Abused:   Marland Kitchen Physically Abused:   . Sexually Abused:     ROS:  Constitutional: Denies fever, malaise, fatigue, headache or abrupt weight changes.  HEENT: Denies eye pain, eye redness, ear pain, ringing in the ears, wax buildup, runny nose, nasal congestion, bloody nose, or sore throat. Respiratory: Denies difficulty breathing, shortness of breath, cough or sputum  production.   Cardiovascular: Denies chest pain, chest tightness, palpitations or swelling in the hands or feet.  Gastrointestinal: Denies abdominal pain, bloating, constipation, diarrhea or blood in the stool.  GU: Denies frequency, urgency, pain with urination, blood in urine, odor or discharge. Musculoskeletal: Denies decrease in range of motion, difficulty with gait, muscle pain or joint pain and swelling.  Skin: Denies redness, rashes, lesions or ulcercations.  Neurological: Denies dizziness, difficulty with memory, difficulty with speech or problems with balance and coordination.  Psych: Pt has a history of anxiety. Denies depression, SI/HI.  No other specific complaints in a complete review of systems (except as listed in HPI above).  PE:  BP (!) 142/96   Pulse 86   Temp 98 F (36.7 C) (Temporal)   Ht 5' 11.75" (1.822 m)   Wt (!) 239 lb (108.4 kg)   SpO2 98%   BMI 32.64 kg/m   Wt Readings from Last 3 Encounters:  01/11/20 240 lb (108.9 kg)  09/20/18 242 lb 6.4 oz (110 kg)  09/22/17 246 lb 6.4 oz (111.8 kg)    General: Appears his stated age, obese, in NAD. Cardiovascular: Normal rate and rhythm. S1,S2 noted.  No murmur, rubs or gallops noted. No JVD or BLE edema. Pulmonary/Chest: Normal effort and positive vesicular breath sounds. No respiratory distress. No wheezes, rales or ronchi noted.  Musculoskeletal:  No difficulty with gait.  Neurological: Alert and oriented.  Psychiatric: Mood and affect normal. Behavior is normal. Judgment and thought content normal.    BMET    Component Value Date/Time   NA 138 09/20/2018 1037   K 4.1 09/20/2018 1037   CL 101 09/20/2018 1037   CO2 28 09/20/2018 1037   GLUCOSE 102 (H) 09/20/2018 1037   BUN 13 09/20/2018 1037   CREATININE 0.90 09/20/2018 1037   CALCIUM 9.5 09/20/2018 1037   GFRNONAA >60 06/21/2009 0525   GFRAA  06/21/2009 0525    >60        The eGFR has been calculated using the MDRD equation. This calculation has  not been validated in all clinical situations. eGFR's persistently <60 mL/min signify possible Chronic Kidney Disease.    Lipid Panel     Component Value Date/Time   CHOL 211 (H) 09/20/2018 1037   TRIG 177.0 (H) 09/20/2018 1037   HDL 42.50 09/20/2018 1037   CHOLHDL 5 09/20/2018 1037   VLDL 35.4 09/20/2018 1037   LDLCALC 134 (H) 09/20/2018 1037    CBC    Component Value Date/Time   WBC 7.4 09/20/2018 1037   RBC 5.25 09/20/2018 1037   HGB 16.5 09/20/2018 1037   HCT 47.2 09/20/2018 1037   PLT 249.0 09/20/2018 1037   MCV 90.0 09/20/2018  1037   MCHC 34.9 09/20/2018 1037   RDW 12.3 09/20/2018 1037   LYMPHSABS 2.9 09/20/2018 1037   MONOABS 0.8 09/20/2018 1037   EOSABS 0.2 09/20/2018 1037   BASOSABS 0.0 09/20/2018 1037    Hgb A1C Lab Results  Component Value Date   HGBA1C 5.7 08/07/2012     Assessment and Plan:   Webb Silversmith, NP This visit occurred during the SARS-CoV-2 public health emergency.  Safety protocols were in place, including screening questions prior to the visit, additional usage of staff PPE, and extensive cleaning of exam room while observing appropriate contact time as indicated for disinfecting solutions.

## 2020-03-06 ENCOUNTER — Other Ambulatory Visit: Payer: Self-pay

## 2020-03-06 ENCOUNTER — Encounter: Payer: Self-pay | Admitting: Internal Medicine

## 2020-03-06 ENCOUNTER — Ambulatory Visit (INDEPENDENT_AMBULATORY_CARE_PROVIDER_SITE_OTHER): Payer: BC Managed Care – PPO | Admitting: Internal Medicine

## 2020-03-06 VITALS — BP 140/90 | HR 87 | Temp 97.9°F | Ht 71.75 in | Wt 239.0 lb

## 2020-03-06 DIAGNOSIS — Z0000 Encounter for general adult medical examination without abnormal findings: Secondary | ICD-10-CM

## 2020-03-06 DIAGNOSIS — Z125 Encounter for screening for malignant neoplasm of prostate: Secondary | ICD-10-CM | POA: Diagnosis not present

## 2020-03-06 DIAGNOSIS — I10 Essential (primary) hypertension: Secondary | ICD-10-CM

## 2020-03-06 LAB — CBC
HCT: 46.9 % (ref 39.0–52.0)
Hemoglobin: 16 g/dL (ref 13.0–17.0)
MCHC: 34.2 g/dL (ref 30.0–36.0)
MCV: 92 fl (ref 78.0–100.0)
Platelets: 248 10*3/uL (ref 150.0–400.0)
RBC: 5.09 Mil/uL (ref 4.22–5.81)
RDW: 12.3 % (ref 11.5–15.5)
WBC: 8.4 10*3/uL (ref 4.0–10.5)

## 2020-03-06 LAB — COMPREHENSIVE METABOLIC PANEL
ALT: 42 U/L (ref 0–53)
AST: 22 U/L (ref 0–37)
Albumin: 4.5 g/dL (ref 3.5–5.2)
Alkaline Phosphatase: 86 U/L (ref 39–117)
BUN: 16 mg/dL (ref 6–23)
CO2: 29 mEq/L (ref 19–32)
Calcium: 9.7 mg/dL (ref 8.4–10.5)
Chloride: 100 mEq/L (ref 96–112)
Creatinine, Ser: 1 mg/dL (ref 0.40–1.50)
GFR: 76.86 mL/min (ref >60.00)
Glucose, Bld: 105 mg/dL — ABNORMAL HIGH (ref 70–99)
Potassium: 4.4 mEq/L (ref 3.5–5.1)
Sodium: 136 mEq/L (ref 135–145)
Total Bilirubin: 0.7 mg/dL (ref 0.2–1.2)
Total Protein: 7.2 g/dL (ref 6.0–8.3)

## 2020-03-06 LAB — LIPID PANEL
Cholesterol: 170 mg/dL (ref 0–200)
HDL: 42.5 mg/dL (ref >39.00)
LDL Cholesterol: 111 mg/dL — ABNORMAL HIGH (ref 0–99)
NonHDL: 127.84
Total CHOL/HDL Ratio: 4
Triglycerides: 83 mg/dL (ref 0.0–149.0)
VLDL: 16.6 mg/dL (ref 0.0–40.0)

## 2020-03-06 LAB — HEMOGLOBIN A1C: Hgb A1c MFr Bld: 5.6 % (ref 4.6–6.5)

## 2020-03-06 LAB — PSA: PSA: 0.79 ng/mL (ref 0.10–4.00)

## 2020-03-06 NOTE — Assessment & Plan Note (Signed)
Will continue Lisinopril in addition to Triamterene HCTZ Reinforced DASH diet and exercise for weight loss CMET today Will monitor

## 2020-03-06 NOTE — Patient Instructions (Signed)

## 2020-03-06 NOTE — Progress Notes (Signed)
Subjective:    Patient ID: Eric Pierce, male    DOB: 06/26/1961, 59 y.o.   MRN: [id number]  HPI  Patient presents the clinic today for his annual exam.  He is also due for 2-week follow-up of hypertension.  At his last visit, we added Lisinopril in addition to his Triamterene HCT.  He reports he did not take his medication this morning and only picked up the medication 3 days ago.  He denies adverse side effects.  His BP today is 140/90.  ECG from 12/2011 reviewed.  Flu: 03/2019 Tetanus: 02/2016 Covid: never PSA screening: 08/2018 Colon screening: 05/2015, 10 years Vision screening: as needed Dentist: annually  Diet: He does eat meat. He consumes fruits and veggies daily. He tries to avoid fried foods. He drinks mostly water, soda. Exercise: Working on the farm.  Review of Systems      Past Medical History:  Diagnosis Date  . Anxiety   . Meniere disease   . Post-operative nausea and vomiting     Current Outpatient Medications  Medication Sig Dispense Refill  . aspirin (ASPIRIN LOW DOSE) 81 MG tablet Take 81 mg by mouth daily.      Marland Kitchen buPROPion (WELLBUTRIN XL) 300 MG 24 hr tablet Take 1qd (Plz schedule virtual visit for future fills) 90 tablet 3  . diazepam (VALIUM) 5 MG tablet Take 1 tablet by mouth as needed.    . ezetimibe-simvastatin (VYTORIN) 10-20 MG tablet TAKE 1 TABLET BY MOUTH EVERYDAY AT BEDTIME 90 tablet 0  . lisinopril (ZESTRIL) 10 MG tablet Take 1 tablet (10 mg total) by mouth daily. 30 tablet 0  . Omega-3 Fatty Acids (FISH OIL) 1000 MG CAPS Take 2 capsules by mouth daily.     Marland Kitchen triamterene-hydrochlorothiazide (DYAZIDE) 37.5-25 MG per capsule Take 1 capsule by mouth daily.     No current facility-administered medications for this visit.    Allergies  Allergen Reactions  . Hydrocodone     Severe vomiting  . Morphine And Related     Caused nausea and vomiting    Family History  Problem Relation Age of Onset  . Heart disease Father   . Cancer Father         prostate  . Heart disease Brother   . Colon cancer Paternal Grandmother   . Prostate cancer Paternal Grandfather     Social History   Socioeconomic History  . Marital status: Married    Spouse name: Not on file  . Number of children: 2  . Years of education: Not on file  . Highest education level: Not on file  Occupational History  . Occupation: Landscaper  Tobacco Use  . Smoking status: Never Smoker  . Smokeless tobacco: Never Used  Substance and Sexual Activity  . Alcohol use: No    Alcohol/week: 0.0 standard drinks  . Drug use: No  . Sexual activity: Not on file  Other Topics Concern  . Not on file  Social History Narrative  . Not on file   Social Determinants of Health   Financial Resource Strain:   . Difficulty of Paying Living Expenses:   Food Insecurity:   . Worried About Charity fundraiser in the Last Year:   . Arboriculturist in the Last Year:   Transportation Needs:   . Film/video editor (Medical):   Marland Kitchen Lack of Transportation (Non-Medical):   Physical Activity:   . Days of Exercise per Week:   . Minutes of Exercise per  Session:   Stress:   . Feeling of Stress :   Social Connections:   . Frequency of Communication with Friends and Family:   . Frequency of Social Gatherings with Friends and Family:   . Attends Religious Services:   . Active Member of Clubs or Organizations:   . Attends Archivist Meetings:   Marland Kitchen Marital Status:   Intimate Partner Violence:   . Fear of Current or Ex-Partner:   . Emotionally Abused:   Marland Kitchen Physically Abused:   . Sexually Abused:      Constitutional: Denies fever, malaise, fatigue, headache or abrupt weight changes.  HEENT: Pt reports decreased hearing, left ear. Denies eye pain, eye redness, ear pain, ringing in the ears, wax buildup, runny nose, nasal congestion, bloody nose, or sore throat. Respiratory: Denies difficulty breathing, shortness of breath, cough or sputum production.   Cardiovascular:  Denies chest pain, chest tightness, palpitations or swelling in the hands or feet.  Gastrointestinal: Denies abdominal pain, bloating, constipation, diarrhea or blood in the stool.  GU: Denies urgency, frequency, pain with urination, burning sensation, blood in urine, odor or discharge. Musculoskeletal: Pt reports intermittent aches and pains. Denies decrease in range of motion, difficulty with gait, or joint swelling.  Skin: Denies redness, rashes, lesions or ulcercations.  Neurological: Pt reports intermittent dizziness. Denies difficulty with memory, difficulty with speech or problems with balance and coordination.  Psych: Pt has a history of anxiety. Denies depression, SI/HI.  No other specific complaints in a complete review of systems (except as listed in HPI above).  Objective:   Physical Exam    BP 140/90   Pulse 87   Temp 97.9 F (36.6 C) (Temporal)   Ht 5' 11.75" (1.822 m)   Wt 239 lb (108.4 kg)   SpO2 98%   BMI 32.64 kg/m   Wt Readings from Last 3 Encounters:  02/21/20 (!) 239 lb (108.4 kg)  01/11/20 240 lb (108.9 kg)  09/20/18 242 lb 6.4 oz (110 kg)    General: Appears his stated age, obese, in NAD. Skin: Warm, dry and intact. No rashesnoted. HEENT: Head: normal shape and size; Eyes: sclera white, no icterus, conjunctiva pink, PERRLA and EOMs intact;  Neck:  Neck supple, trachea midline. No masses, lumps or thyromegaly present.  Cardiovascular: Normal rate and rhythm. S1,S2 noted.  No murmur, rubs or gallops noted. No JVD or BLE edema. No carotid bruits noted. Pulmonary/Chest: Normal effort and positive vesicular breath sounds. No respiratory distress. No wheezes, rales or ronchi noted.  Abdomen: Soft and nontender. Normal bowel sounds. Umbilical hernia noted. Liver, spleen and kidneys non palpable. Musculoskeletal: Strength 5/5 BUE/BLE. No difficulty with gait.  Neurological: Alert and oriented. Cranial nerves II-XII grossly intact. Coordination normal.    Psychiatric: Mood and affect normal. Behavior is normal. Judgment and thought content normal.    BMET    Component Value Date/Time   NA 138 09/20/2018 1037   K 4.1 09/20/2018 1037   CL 101 09/20/2018 1037   CO2 28 09/20/2018 1037   GLUCOSE 102 (H) 09/20/2018 1037   BUN 13 09/20/2018 1037   CREATININE 0.90 09/20/2018 1037   CALCIUM 9.5 09/20/2018 1037   GFRNONAA >60 06/21/2009 0525   GFRAA  06/21/2009 0525    >60        The eGFR has been calculated using the MDRD equation. This calculation has not been validated in all clinical situations. eGFR's persistently <60 mL/min signify possible Chronic Kidney Disease.    Lipid Panel  Component Value Date/Time   CHOL 211 (H) 09/20/2018 1037   TRIG 177.0 (H) 09/20/2018 1037   HDL 42.50 09/20/2018 1037   CHOLHDL 5 09/20/2018 1037   VLDL 35.4 09/20/2018 1037   LDLCALC 134 (H) 09/20/2018 1037    CBC    Component Value Date/Time   WBC 7.4 09/20/2018 1037   RBC 5.25 09/20/2018 1037   HGB 16.5 09/20/2018 1037   HCT 47.2 09/20/2018 1037   PLT 249.0 09/20/2018 1037   MCV 90.0 09/20/2018 1037   MCHC 34.9 09/20/2018 1037   RDW 12.3 09/20/2018 1037   LYMPHSABS 2.9 09/20/2018 1037   MONOABS 0.8 09/20/2018 1037   EOSABS 0.2 09/20/2018 1037   BASOSABS 0.0 09/20/2018 1037    Hgb A1C Lab Results  Component Value Date   HGBA1C 5.7 08/07/2012          Assessment & Plan:   Preventative Health Maintenance:  Encouraged him to get a flu shot in the fall Tetanus UTD He declines Covid vaccine Colon screening UTD Encouraged him to consume a balanced diet and exercise regimen Advised him to see an eye doctor and dentist annually We will check CBC, C met, lipid, A1c, PSA  today  RTC in 2 weeks, nurse visit for BP check Webb Silversmith, NP This visit occurred during the SARS-CoV-2 public health emergency.  Safety protocols were in place, including screening questions prior to the visit, additional usage of staff PPE, and  extensive cleaning of exam room while observing appropriate contact time as indicated for disinfecting solutions.

## 2020-03-24 ENCOUNTER — Encounter: Payer: Self-pay | Admitting: Internal Medicine

## 2020-03-24 ENCOUNTER — Telehealth: Payer: Self-pay | Admitting: *Deleted

## 2020-03-24 ENCOUNTER — Other Ambulatory Visit (HOSPITAL_COMMUNITY): Payer: Self-pay | Admitting: Nurse Practitioner

## 2020-03-24 ENCOUNTER — Encounter: Payer: Self-pay | Admitting: Nurse Practitioner

## 2020-03-24 ENCOUNTER — Other Ambulatory Visit: Payer: Self-pay | Admitting: Internal Medicine

## 2020-03-24 ENCOUNTER — Telehealth (INDEPENDENT_AMBULATORY_CARE_PROVIDER_SITE_OTHER): Payer: BC Managed Care – PPO | Admitting: Internal Medicine

## 2020-03-24 ENCOUNTER — Other Ambulatory Visit: Payer: Self-pay | Admitting: Family Medicine

## 2020-03-24 DIAGNOSIS — R1111 Vomiting without nausea: Secondary | ICD-10-CM | POA: Diagnosis not present

## 2020-03-24 DIAGNOSIS — J029 Acute pharyngitis, unspecified: Secondary | ICD-10-CM | POA: Diagnosis not present

## 2020-03-24 DIAGNOSIS — U071 COVID-19: Secondary | ICD-10-CM

## 2020-03-24 DIAGNOSIS — R05 Cough: Secondary | ICD-10-CM

## 2020-03-24 DIAGNOSIS — R059 Cough, unspecified: Secondary | ICD-10-CM

## 2020-03-24 DIAGNOSIS — R195 Other fecal abnormalities: Secondary | ICD-10-CM

## 2020-03-24 MED ORDER — ONDANSETRON 4 MG PO TBDP: 4.0000 mg | ORAL_TABLET | Freq: Three times a day (TID) | ORAL | 0 refills | Status: DC | PRN

## 2020-03-24 NOTE — Patient Instructions (Signed)
COVID-19 COVID-19 is a respiratory infection that is caused by a virus called severe acute respiratory syndrome coronavirus 2 (SARS-CoV-2). The disease is also known as coronavirus disease or novel coronavirus. In some people, the virus may not cause any symptoms. In others, it may cause a serious infection. The infection can get worse quickly and can lead to complications, such as:  Pneumonia, or infection of the lungs.  Acute respiratory distress syndrome or ARDS. This is a condition in which fluid build-up in the lungs prevents the lungs from filling with air and passing oxygen into the blood.  Acute respiratory failure. This is a condition in which there is not enough oxygen passing from the lungs to the body or when carbon dioxide is not passing from the lungs out of the body.  Sepsis or septic shock. This is a serious bodily reaction to an infection.  Blood clotting problems.  Secondary infections due to bacteria or fungus.  Organ failure. This is when your body's organs stop working. The virus that causes COVID-19 is contagious. This means that it can spread from person to person through droplets from coughs and sneezes (respiratory secretions). What are the causes? This illness is caused by a virus. You may catch the virus by:  Breathing in droplets from an infected person. Droplets can be spread by a person breathing, speaking, singing, coughing, or sneezing.  Touching something, like a table or a doorknob, that was exposed to the virus (contaminated) and then touching your mouth, nose, or eyes. What increases the risk? Risk for infection You are more likely to be infected with this virus if you:  Are within 6 feet (2 meters) of a person with COVID-19.  Provide care for or live with a person who is infected with COVID-19.  Spend time in crowded indoor spaces or live in shared housing. Risk for serious illness You are more likely to become seriously ill from the virus if you:   Are 50 years of age or older. The higher your age, the more you are at risk for serious illness.  Live in a nursing home or long-term care facility.  Have cancer.  Have a long-term (chronic) disease such as: ? Chronic lung disease, including chronic obstructive pulmonary disease or asthma. ? A long-term disease that lowers your body's ability to fight infection (immunocompromised). ? Heart disease, including heart failure, a condition in which the arteries that lead to the heart become narrow or blocked (coronary artery disease), a disease which makes the heart muscle thick, weak, or stiff (cardiomyopathy). ? Diabetes. ? Chronic kidney disease. ? Sickle cell disease, a condition in which red blood cells have an abnormal "sickle" shape. ? Liver disease.  Are obese. What are the signs or symptoms? Symptoms of this condition can range from mild to severe. Symptoms may appear any time from 2 to 14 days after being exposed to the virus. They include:  A fever or chills.  A cough.  Difficulty breathing.  Headaches, body aches, or muscle aches.  Runny or stuffy (congested) nose.  A sore throat.  New loss of taste or smell. Some people may also have stomach problems, such as nausea, vomiting, or diarrhea. Other people may not have any symptoms of COVID-19. How is this diagnosed? This condition may be diagnosed based on:  Your signs and symptoms, especially if: ? You live in an area with a COVID-19 outbreak. ? You recently traveled to or from an area where the virus is common. ? You   provide care for or live with a person who was diagnosed with COVID-19. ? You were exposed to a person who was diagnosed with COVID-19.  A physical exam.  Lab tests, which may include: ? Taking a sample of fluid from the back of your nose and throat (nasopharyngeal fluid), your nose, or your throat using a swab. ? A sample of mucus from your lungs (sputum). ? Blood tests.  Imaging tests, which  may include, X-rays, CT scan, or ultrasound. How is this treated? At present, there is no medicine to treat COVID-19. Medicines that treat other diseases are being used on a trial basis to see if they are effective against COVID-19. Your health care provider will talk with you about ways to treat your symptoms. For most people, the infection is mild and can be managed at home with rest, fluids, and over-the-counter medicines. Treatment for a serious infection usually takes places in a hospital intensive care unit (ICU). It may include one or more of the following treatments. These treatments are given until your symptoms improve.  Receiving fluids and medicines through an IV.  Supplemental oxygen. Extra oxygen is given through a tube in the nose, a face mask, or a hood.  Positioning you to lie on your stomach (prone position). This makes it easier for oxygen to get into the lungs.  Continuous positive airway pressure (CPAP) or bi-level positive airway pressure (BPAP) machine. This treatment uses mild air pressure to keep the airways open. A tube that is connected to a motor delivers oxygen to the body.  Ventilator. This treatment moves air into and out of the lungs by using a tube that is placed in your windpipe.  Tracheostomy. This is a procedure to create a hole in the neck so that a breathing tube can be inserted.  Extracorporeal membrane oxygenation (ECMO). This procedure gives the lungs a chance to recover by taking over the functions of the heart and lungs. It supplies oxygen to the body and removes carbon dioxide. Follow these instructions at home: Lifestyle  If you are sick, stay home except to get medical care. Your health care provider will tell you how long to stay home. Call your health care provider before you go for medical care.  Rest at home as told by your health care provider.  Do not use any products that contain nicotine or tobacco, such as cigarettes, e-cigarettes, and  chewing tobacco. If you need help quitting, ask your health care provider.  Return to your normal activities as told by your health care provider. Ask your health care provider what activities are safe for you. General instructions  Take over-the-counter and prescription medicines only as told by your health care provider.  Drink enough fluid to keep your urine pale yellow.  Keep all follow-up visits as told by your health care provider. This is important. How is this prevented?  There is no vaccine to help prevent COVID-19 infection. However, there are steps you can take to protect yourself and others from this virus. To protect yourself:   Do not travel to areas where COVID-19 is a risk. The areas where COVID-19 is reported change often. To identify high-risk areas and travel restrictions, check the CDC travel website: wwwnc.cdc.gov/travel/notices  If you live in, or must travel to, an area where COVID-19 is a risk, take precautions to avoid infection. ? Stay away from people who are sick. ? Wash your hands often with soap and water for 20 seconds. If soap and water   are not available, use an alcohol-based hand sanitizer. ? Avoid touching your mouth, face, eyes, or nose. ? Avoid going out in public, follow guidance from your state and local health authorities. ? If you must go out in public, wear a cloth face covering or face mask. Make sure your mask covers your nose and mouth. ? Avoid crowded indoor spaces. Stay at least 6 feet (2 meters) away from others. ? Disinfect objects and surfaces that are frequently touched every day. This may include:  Counters and tables.  Doorknobs and light switches.  Sinks and faucets.  Electronics, such as phones, remote controls, keyboards, computers, and tablets. To protect others: If you have symptoms of COVID-19, take steps to prevent the virus from spreading to others.  If you think you have a COVID-19 infection, contact your health care  provider right away. Tell your health care team that you think you may have a COVID-19 infection.  Stay home. Leave your house only to seek medical care. Do not use public transport.  Do not travel while you are sick.  Wash your hands often with soap and water for 20 seconds. If soap and water are not available, use alcohol-based hand sanitizer.  Stay away from other members of your household. Let healthy household members care for children and pets, if possible. If you have to care for children or pets, wash your hands often and wear a mask. If possible, stay in your own room, separate from others. Use a different bathroom.  Make sure that all people in your household wash their hands well and often.  Cough or sneeze into a tissue or your sleeve or elbow. Do not cough or sneeze into your hand or into the air.  Wear a cloth face covering or face mask. Make sure your mask covers your nose and mouth. Where to find more information  Centers for Disease Control and Prevention: www.cdc.gov/coronavirus/2019-ncov/index.html  World Health Organization: www.who.int/health-topics/coronavirus Contact a health care provider if:  You live in or have traveled to an area where COVID-19 is a risk and you have symptoms of the infection.  You have had contact with someone who has COVID-19 and you have symptoms of the infection. Get help right away if:  You have trouble breathing.  You have pain or pressure in your chest.  You have confusion.  You have bluish lips and fingernails.  You have difficulty waking from sleep.  You have symptoms that get worse. These symptoms may represent a serious problem that is an emergency. Do not wait to see if the symptoms will go away. Get medical help right away. Call your local emergency services (911 in the [place] Do not drive yourself to the hospital. Let the emergency medical personnel know if you think you have COVID-19. Summary  COVID-19 is a  respiratory infection that is caused by a virus. It is also known as coronavirus disease or novel coronavirus. It can cause serious infections, such as pneumonia, acute respiratory distress syndrome, acute respiratory failure, or sepsis.  The virus that causes COVID-19 is contagious. This means that it can spread from person to person through droplets from breathing, speaking, singing, coughing, or sneezing.  You are more likely to develop a serious illness if you are 50 years of age or older, have a weak immune system, live in a nursing home, or have chronic disease.  There is no medicine to treat COVID-19. Your health care provider will talk with you about ways to treat your symptoms.    Take steps to protect yourself and others from infection. Wash your hands often and disinfect objects and surfaces that are frequently touched every day. Stay away from people who are sick and wear a mask if you are sick. This information is not intended to replace advice given to you by your health care provider. Make sure you discuss any questions you have with your health care provider. Document Revised: 05/11/2019 Document Reviewed: 08/17/2018 [employer] Patient Education  2020 [employer]  

## 2020-03-24 NOTE — Progress Notes (Signed)
Virtual Visit via Video Note  I connected with Eric Pierce on 03/24/20 at  2:00 PM EDT by a video enabled telemedicine application and verified that I am speaking with the correct person using two identifiers.  Location: Patient: Office Provider: Office  Persons participating in this video vist: Webb Silversmith, NP and [employer].  I discussed the limitations of evaluation and management by telemedicine and the availability of in person appointments. The patient expressed understanding and agreed to proceed.  History of Present Illness:  Pt reports he had a positive Covid test this morning from [facility]. He reports symptoms started 4 days ago. He reports fever up to 100.3, sore throat, cough, vomiting and fatigue. He denies difficulty swallowing. The cough is dry and nonproductive. He denies current headache, dizziness, visual changes, runny nose, nasal congestion, ear pain, shortness of breath or chest pain. He has vomited and has loose stools but denies nausea or blood in his stool. He denies rash. He has tried Tylenol, Ibuprofen and Mucinex with minimal relief of symptoms.     Past Medical History:  Diagnosis Date  . Anxiety   . Meniere disease   . Post-operative nausea and vomiting     Current Outpatient Medications  Medication Sig Dispense Refill  . aspirin (ASPIRIN LOW DOSE) 81 MG tablet Take 81 mg by mouth daily.      Marland Kitchen buPROPion (WELLBUTRIN XL) 300 MG 24 hr tablet Take 1qd (Plz schedule virtual visit for future fills) 90 tablet 3  . diazepam (VALIUM) 5 MG tablet Take 1 tablet by mouth as needed.    . diphenhydrAMINE (BENADRYL) 25 MG tablet Take 25 mg by mouth at bedtime.    Marland Kitchen ezetimibe-simvastatin (VYTORIN) 10-20 MG tablet TAKE 1 TABLET BY MOUTH EVERYDAY AT BEDTIME 90 tablet 0  . lisinopril (ZESTRIL) 10 MG tablet Take 1 tablet (10 mg total) by mouth daily. 30 tablet 0  . meclizine (ANTIVERT) 25 MG tablet Take 25 mg by mouth as needed for dizziness.    .  montelukast (SINGULAIR) 10 MG tablet Take by mouth.    . Omega-3 Fatty Acids (FISH OIL) 1000 MG CAPS Take 2 capsules by mouth daily.     Marland Kitchen triamterene-hydrochlorothiazide (DYAZIDE) 37.5-25 MG per capsule Take 1 capsule by mouth daily.    . ondansetron (ZOFRAN ODT) 4 MG disintegrating tablet Take 1 tablet (4 mg total) by mouth every 8 (eight) hours as needed for nausea or vomiting. 20 tablet 0   No current facility-administered medications for this visit.    Allergies  Allergen Reactions  . Hydrocodone     Severe vomiting  . Morphine And Related     Caused nausea and vomiting    Family History  Problem Relation Age of Onset  . Heart disease Father   . Cancer Father        prostate  . Heart disease Brother   . Colon cancer Paternal Grandmother   . Prostate cancer Paternal Grandfather     Social History   Socioeconomic History  . Marital status: Married    Spouse name: Not on file  . Number of children: 2  . Years of education: Not on file  . Highest education level: Not on file  Occupational History  . Occupation: Landscaper  Tobacco Use  . Smoking status: Never Smoker  . Smokeless tobacco: Never Used  Substance and Sexual Activity  . Alcohol use: No    Alcohol/week: 0.0 standard drinks  . Drug use: No  .  Sexual activity: Not on file  Other Topics Concern  . Not on file  Social History Narrative  . Not on file   Social Determinants of Health   Financial Resource Strain:   . Difficulty of Paying Living Expenses: Not on file  Food Insecurity:   . Worried About Charity fundraiser in the Last Year: Not on file  . Ran Out of Food in the Last Year: Not on file  Transportation Needs:   . Lack of Transportation (Medical): Not on file  . Lack of Transportation (Non-Medical): Not on file  Physical Activity:   . Days of Exercise per Week: Not on file  . Minutes of Exercise per Session: Not on file  Stress:   . Feeling of Stress : Not on file  Social Connections:    . Frequency of Communication with Friends and Family: Not on file  . Frequency of Social Gatherings with Friends and Family: Not on file  . Attends Religious Services: Not on file  . Active Member of Clubs or Organizations: Not on file  . Attends Archivist Meetings: Not on file  . Marital Status: Not on file  Intimate Partner Violence:   . Fear of Current or Ex-Partner: Not on file  . Emotionally Abused: Not on file  . Physically Abused: Not on file  . Sexually Abused: Not on file     Constitutional: Pt reports fatigue, fever. Denies headache or abrupt weight changes.  HEENT: Pt reports sore throat. Denies eye pain, eye redness, ear pain, ringing in the ears, wax buildup, runny nose, nasal congestion, bloody nose. Respiratory: Pt reports cough. Denies difficulty breathing, shortness of breath.   Cardiovascular: Denies chest pain, chest tightness, palpitations or swelling in the hands or feet.  Gastrointestinal: Pt reports vomiting, diarrhea. Denies abdominal pain, bloating, constipation, or blood in the stool.  Skin: Denies redness, rashes, lesions or ulcercations.    No other specific complaints in a complete review of systems (except as listed in HPI above).  Observations/Objective:   Wt Readings from Last 3 Encounters:  03/06/20 239 lb (108.4 kg)  02/21/20 (!) 239 lb (108.4 kg)  01/11/20 240 lb (108.9 kg)    General: Appears his stated age, ill appearing in NAD. HEENT: Nose: congested ; Throat/Mouth: hoarse Pulmonary/Chest: Normal effort. No respiratory distress. Neurological: Alert and oriented.   BMET    Component Value Date/Time   NA 136 03/06/2020 0912   K 4.4 03/06/2020 0912   CL 100 03/06/2020 0912   CO2 29 03/06/2020 0912   GLUCOSE 105 (H) 03/06/2020 0912   BUN 16 03/06/2020 0912   CREATININE 1.00 03/06/2020 0912   CALCIUM 9.7 03/06/2020 0912   GFRNONAA >60 06/21/2009 0525   GFRAA  06/21/2009 0525    >60        The eGFR has been  calculated using the MDRD equation. This calculation has not been validated in all clinical situations. eGFR's persistently <60 mL/min signify possible Chronic Kidney Disease.    Lipid Panel     Component Value Date/Time   CHOL 170 03/06/2020 0912   TRIG 83.0 03/06/2020 0912   HDL 42.50 03/06/2020 0912   CHOLHDL 4 03/06/2020 0912   VLDL 16.6 03/06/2020 0912   LDLCALC 111 (H) 03/06/2020 0912    CBC    Component Value Date/Time   WBC 8.4 03/06/2020 0912   RBC 5.09 03/06/2020 0912   HGB 16.0 03/06/2020 0912   HCT 46.9 03/06/2020 0912   PLT 248.0  03/06/2020 0912   MCV 92.0 03/06/2020 0912   MCHC 34.2 03/06/2020 0912   RDW 12.3 03/06/2020 0912   LYMPHSABS 2.9 09/20/2018 1037   MONOABS 0.8 09/20/2018 1037   EOSABS 0.2 09/20/2018 1037   BASOSABS 0.0 09/20/2018 1037    Hgb A1C Lab Results  Component Value Date   HGBA1C 5.6 03/06/2020    Assessment and Plan:  Covid 19, Fever, Sore Throat, Cough, Vomiting, Loose Stools:  Discussed symptomatic care: rest, fluids Encouraged Tylenol for fever, body aches RX for Zofran 4 mg ODT Q8H prn He declines RX for cough syrup Avoid antidiarrheals Encouraged frequent handwashing, masking, social distancing and self quarantine until symptoms have resolved or 10 days ER precautions discussed  Return precautions discussed  Follow Up Instructions:    I discussed the assessment and treatment plan with the patient. The patient was provided an opportunity to ask questions and all were answered. The patient agreed with the plan and demonstrated an understanding of the instructions.   The patient was advised to call back or seek an in-person evaluation if the symptoms worsen or if the condition fails to improve as anticipated.     Webb Silversmith, NP

## 2020-03-24 NOTE — Progress Notes (Signed)
I connected by phone with Eric Pierce on 03/24/2020 at 4:24 PM to discuss the potential use of an new treatment for mild to moderate COVID-19 viral infection in non-hospitalized patients.  This patient is a 59 y.o. male that meets the FDA criteria for Emergency Use Authorization of casirivimab\imdevimab.  Has a (+) direct SARS-CoV-2 viral test result  Has mild or moderate COVID-19   Is ? 57 years of age and weighs ? 40 kg  Is NOT hospitalized due to COVID-19  Is NOT requiring oxygen therapy or requiring an increase in baseline oxygen flow rate due to COVID-19  Is within 10 days of symptom onset  Has at least one of the high risk factor(s) for progression to severe COVID-19 and/or hospitalization as defined in EUA.  Specific high risk criteria : Cardiovascular disease or hypertension, allergies, obesity.   Sx onset 03/20/20.   I have spoken and communicated the following to the patient or parent/caregiver:  1. FDA has authorized the emergency use of casirivimab\imdevimab for the treatment of mild to moderate COVID-19 in adults and pediatric patients with positive results of direct SARS-CoV-2 viral testing who are 66 years of age and older weighing at least 40 kg, and who are at high risk for progressing to severe COVID-19 and/or hospitalization.  2. The significant known and potential risks and benefits of casirivimab\imdevimab, and the extent to which such potential risks and benefits are unknown.  3. Information on available alternative treatments and the risks and benefits of those alternatives, including clinical trials.  4. Patients treated with casirivimab\imdevimab should continue to self-isolate and use infection control measures (e.g., wear mask, isolate, social distance, avoid sharing personal items, clean and disinfect "high touch" surfaces, and frequent handwashing) according to CDC guidelines.   5. The patient or parent/caregiver has the option to accept or refuse  casirivimab\imdevimab .  After reviewing this information with the patient, The patient agreed to proceed with receiving casirivimab\imdevimab infusion and will be provided a copy of the Fact sheet prior to receiving the infusion.Consuello Masse, DNP, AGNP-C [phone] (Infusion Center Hotline)

## 2020-03-24 NOTE — Telephone Encounter (Signed)
Patient called stating that he has covid and wants to be scheduled for an infusion. Patient stated that he was tested yesterday at [facility] but has not gotten his results back yet. Patient stated that his wife and brother both have covid. Patient stated that his wife got sick on 03/15/20. Patient stated that he started getting sick Thursday 03/20/20. Patient stated that he has a cough, fever, vomiting and is real tired. Patient denies SOB or difficulty breathing.  Patient stated that he is able to keep fluids down. Patient stated that he is able to urinate without any problems. Patient stated that his oxygen level this morning is 95%. Patient stated that he has been in quarantine. Patient stated that has not had the covid vaccines. Patient scheduled for a virtual visit with Nicki Reaper NP at 2:00 pm today. Patient was given ER precautions and verbalized understanding.

## 2020-03-25 ENCOUNTER — Emergency Department: Payer: BC Managed Care – PPO

## 2020-03-25 ENCOUNTER — Inpatient Hospital Stay
Admission: EM | Admit: 2020-03-25 | Discharge: 2020-03-27 | DRG: 177 | Disposition: A | Discharge status: Home / Self Care | Payer: BC Managed Care – PPO | Attending: Internal Medicine | Admitting: Internal Medicine

## 2020-03-25 ENCOUNTER — Other Ambulatory Visit: Payer: Self-pay

## 2020-03-25 DIAGNOSIS — U071 COVID-19: Secondary | ICD-10-CM | POA: Diagnosis not present

## 2020-03-25 DIAGNOSIS — Z7982 Long term (current) use of aspirin: Secondary | ICD-10-CM

## 2020-03-25 DIAGNOSIS — I10 Essential (primary) hypertension: Secondary | ICD-10-CM | POA: Diagnosis present

## 2020-03-25 DIAGNOSIS — F411 Generalized anxiety disorder: Secondary | ICD-10-CM | POA: Diagnosis present

## 2020-03-25 DIAGNOSIS — F419 Anxiety disorder, unspecified: Secondary | ICD-10-CM

## 2020-03-25 DIAGNOSIS — Z79899 Other long term (current) drug therapy: Secondary | ICD-10-CM

## 2020-03-25 DIAGNOSIS — E860 Dehydration: Secondary | ICD-10-CM | POA: Diagnosis present

## 2020-03-25 DIAGNOSIS — R55 Syncope and collapse: Secondary | ICD-10-CM | POA: Diagnosis present

## 2020-03-25 DIAGNOSIS — F329 Major depressive disorder, single episode, unspecified: Secondary | ICD-10-CM | POA: Diagnosis present

## 2020-03-25 DIAGNOSIS — E871 Hypo-osmolality and hyponatremia: Secondary | ICD-10-CM | POA: Diagnosis present

## 2020-03-25 DIAGNOSIS — N401 Enlarged prostate with lower urinary tract symptoms: Secondary | ICD-10-CM

## 2020-03-25 DIAGNOSIS — Z8249 Family history of ischemic heart disease and other diseases of the circulatory system: Secondary | ICD-10-CM

## 2020-03-25 DIAGNOSIS — E785 Hyperlipidemia, unspecified: Secondary | ICD-10-CM

## 2020-03-25 DIAGNOSIS — H8109 Meniere's disease, unspecified ear: Secondary | ICD-10-CM | POA: Diagnosis present

## 2020-03-25 DIAGNOSIS — J1282 Pneumonia due to coronavirus disease 2019: Secondary | ICD-10-CM

## 2020-03-25 LAB — CBC
HCT: 43.9 % (ref 39.0–52.0)
Hemoglobin: 16.4 g/dL (ref 13.0–17.0)
MCH: 30.9 pg (ref 26.0–34.0)
MCHC: 37.4 g/dL — ABNORMAL HIGH (ref 30.0–36.0)
MCV: 82.8 fL (ref 80.0–100.0)
Platelets: 192 10*3/uL (ref 150–400)
RBC: 5.3 MIL/uL (ref 4.22–5.81)
RDW: 11.1 % — ABNORMAL LOW (ref 11.5–15.5)
WBC: 6.9 10*3/uL (ref 4.0–10.5)
nRBC: 0 % (ref 0.0–0.2)

## 2020-03-25 LAB — BASIC METABOLIC PANEL
Anion gap: 12 (ref 5–15)
BUN: 12 mg/dL (ref 6–20)
CO2: 24 mmol/L (ref 22–32)
Calcium: 8.6 mg/dL — ABNORMAL LOW (ref 8.9–10.3)
Chloride: 83 mmol/L — ABNORMAL LOW (ref 98–111)
Creatinine, Ser: 0.68 mg/dL (ref 0.61–1.24)
GFR calc Af Amer: >60 mL/min (ref >60)
GFR calc non Af Amer: >60 mL/min (ref >60)
Glucose, Bld: 157 mg/dL — ABNORMAL HIGH (ref 70–99)
Potassium: 3.5 mmol/L (ref 3.5–5.1)
Sodium: 119 mmol/L — CL (ref 135–145)

## 2020-03-25 LAB — URINALYSIS, COMPLETE (UACMP) WITH MICROSCOPIC
Bacteria, UA: NONE SEEN
Bilirubin Urine: NEGATIVE
Glucose, UA: NEGATIVE mg/dL
Hgb urine dipstick: NEGATIVE
Ketones, ur: 80 mg/dL — AB
Leukocytes,Ua: NEGATIVE
Nitrite: NEGATIVE
Protein, ur: NEGATIVE mg/dL
Specific Gravity, Urine: 1.015 (ref 1.005–1.030)
pH: 7 (ref 5.0–8.0)

## 2020-03-25 LAB — HEPATIC FUNCTION PANEL
ALT: 48 U/L — ABNORMAL HIGH (ref 0–44)
AST: 35 U/L (ref 15–41)
Albumin: 4 g/dL (ref 3.5–5.0)
Alkaline Phosphatase: 80 U/L (ref 38–126)
Bilirubin, Direct: 0.1 mg/dL (ref 0.0–0.2)
Indirect Bilirubin: 0.9 mg/dL (ref 0.3–0.9)
Total Bilirubin: 1 mg/dL (ref 0.3–1.2)
Total Protein: 7.2 g/dL (ref 6.5–8.1)

## 2020-03-25 LAB — MAGNESIUM: Magnesium: 1.7 mg/dL (ref 1.7–2.4)

## 2020-03-25 LAB — LACTIC ACID, PLASMA: Lactic Acid, Venous: 1 mmol/L (ref 0.5–1.9)

## 2020-03-25 LAB — SODIUM, URINE, RANDOM: Sodium, Ur: 143 mmol/L

## 2020-03-25 IMAGING — CR DG CHEST 2V
1 series · 3 of 3 positions shown · non-contrast
Comparison: [DATE] chest radiograph and prior.

CLINICAL DATA: Syncope, COVID.

EXAM:
CHEST - 2 VIEW

[Series 1: w chest lat · 0.14mm/px · 3 of 3 slices shown]
[im 1/3]
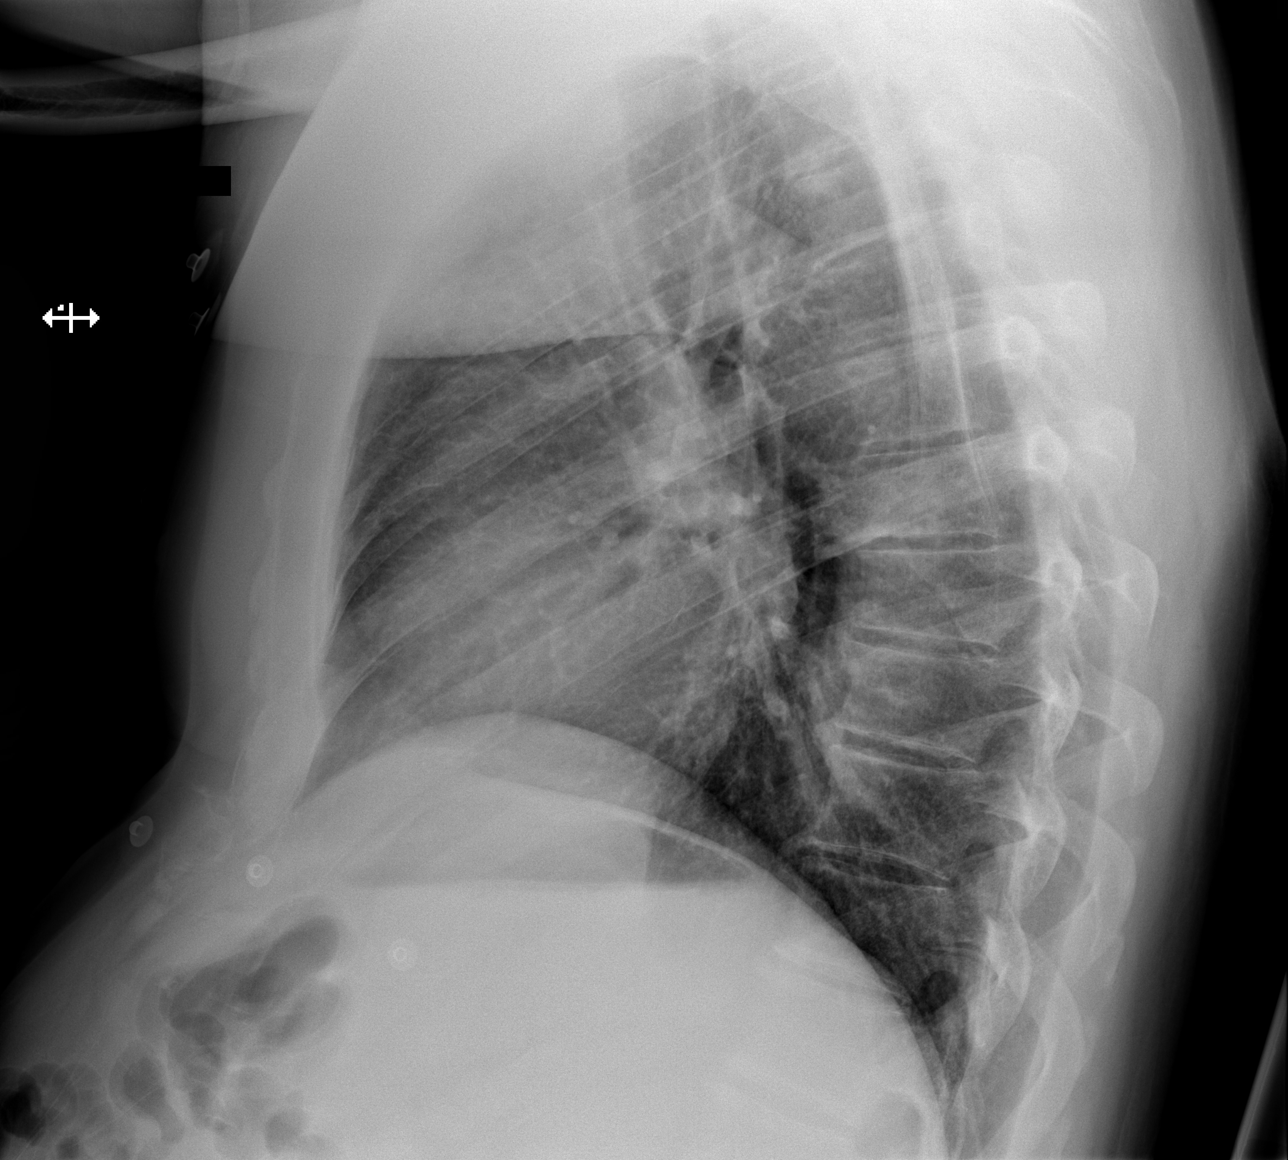
[im 2/3]
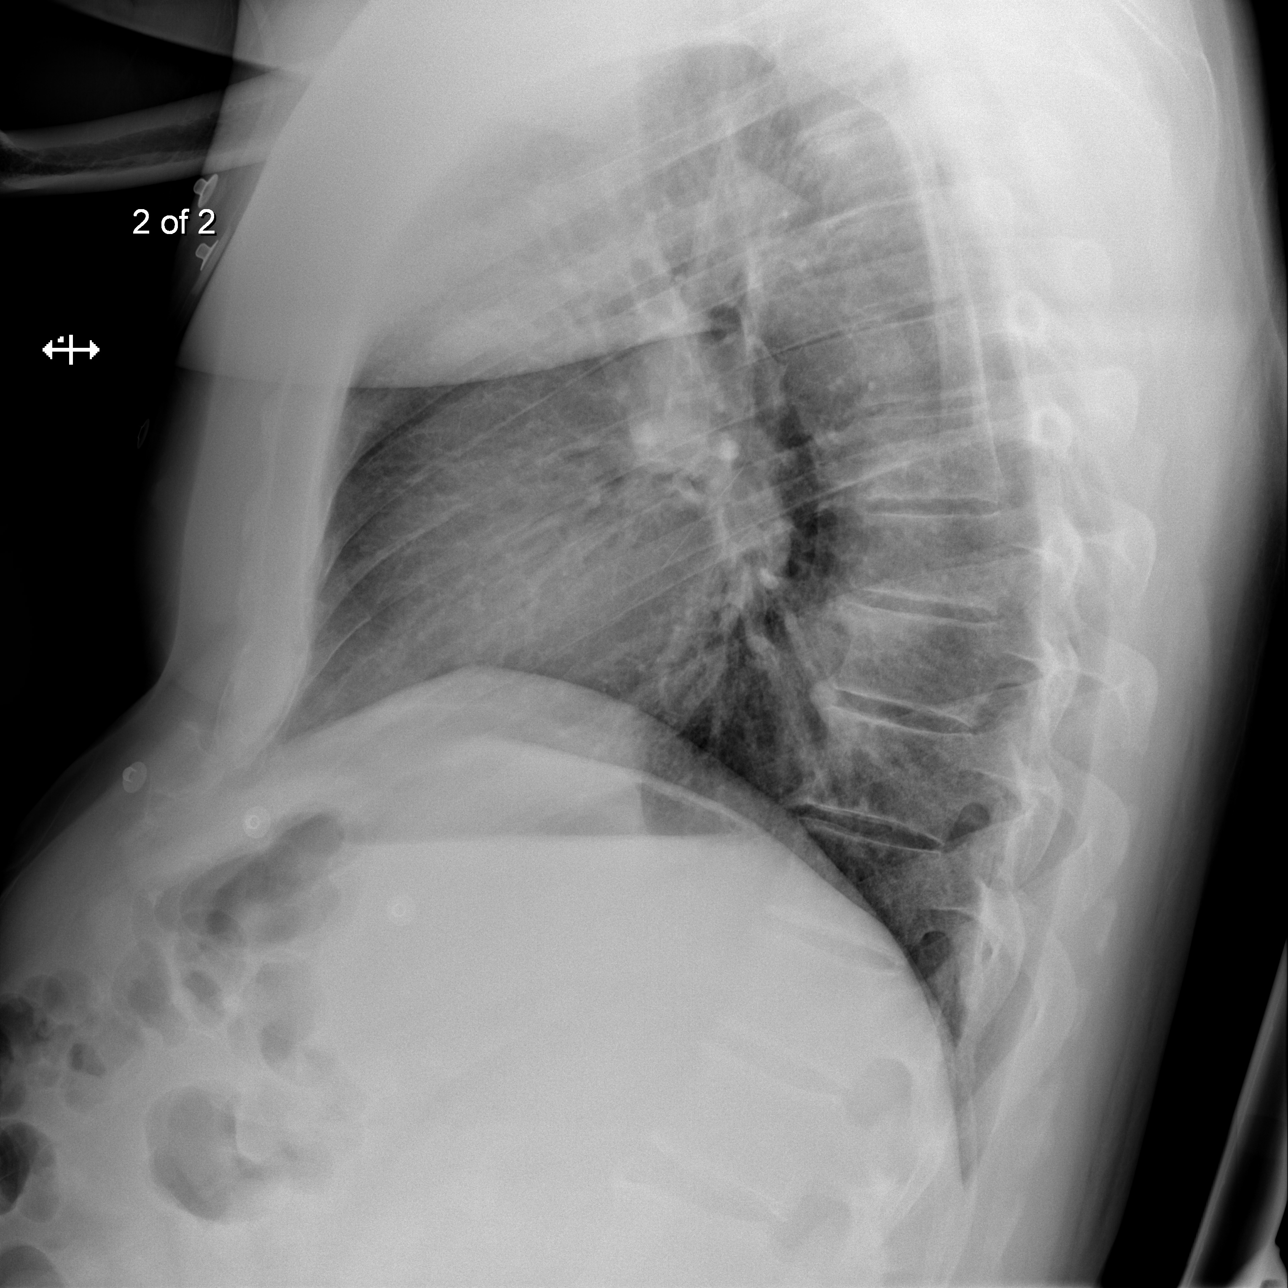
[im 3/3]
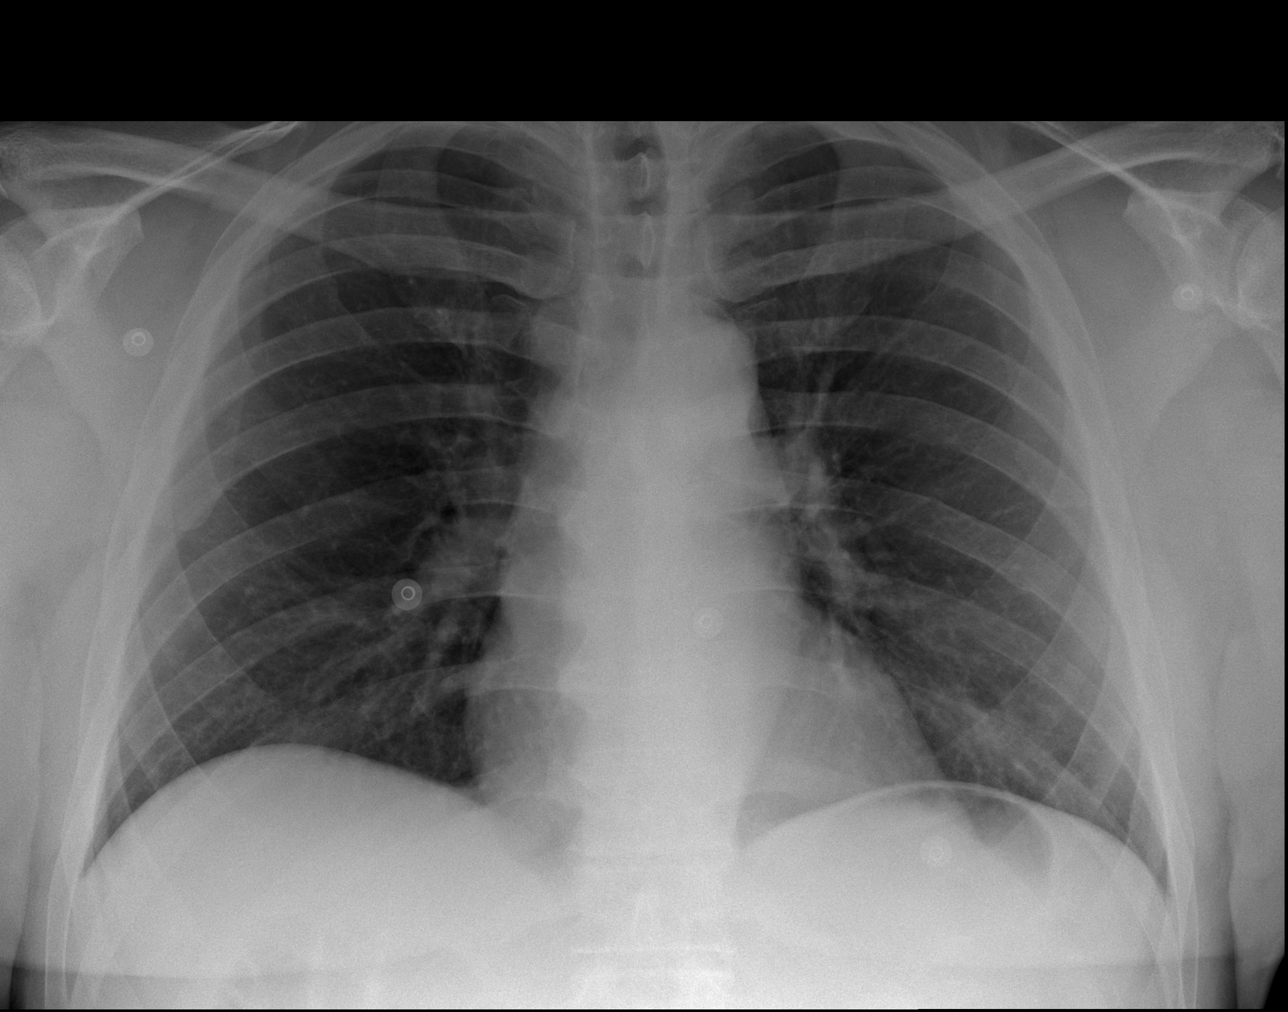

[3 of 3 positions shown; findings below may reference images not displayed]

FINDINGS: The heart size and mediastinal contours are within normal limits.
Left basilar patchy opacities. No pneumothorax or pleural effusion.
No acute osseous abnormality.
IMPRESSION: Minimal patchy left basilar opacities.

## 2020-03-25 MED ORDER — SODIUM CHLORIDE 0.9 % IV BOLUS
1000.0000 mL | Freq: Once | INTRAVENOUS | Status: AC
  Administered 2020-03-25: 1000 mL via INTRAVENOUS

## 2020-03-25 MED ORDER — DEXAMETHASONE SODIUM PHOSPHATE 10 MG/ML IJ SOLN
10.0000 mg | Freq: Once | INTRAMUSCULAR | Status: AC
  Administered 2020-03-25: 10 mg via INTRAVENOUS
  Filled 2020-03-25: qty 1

## 2020-03-25 MED ORDER — ONDANSETRON HCL 4 MG/2ML IJ SOLN: 4.0000 mg | Freq: Once | INTRAMUSCULAR | Status: AC

## 2020-03-25 MED ORDER — ONDANSETRON HCL 4 MG/2ML IJ SOLN
INTRAMUSCULAR | Status: AC
  Administered 2020-03-25: 4 mg via INTRAVENOUS
  Filled 2020-03-25: qty 2

## 2020-03-25 MED ORDER — SODIUM CHLORIDE 0.9 % IV SOLN
100.0000 mg | Freq: Every day | INTRAVENOUS | Status: DC
  Administered 2020-03-27: 09:00:00 100 mg via INTRAVENOUS
  Filled 2020-03-25: qty 20

## 2020-03-25 MED ORDER — SODIUM CHLORIDE 0.9 % IV SOLN
200.0000 mg | Freq: Once | INTRAVENOUS | Status: AC
  Administered 2020-03-26: 200 mg via INTRAVENOUS
  Filled 2020-03-25: qty 200

## 2020-03-25 NOTE — ED Provider Notes (Addendum)
[facility] Emergency Department Provider Note  ____________________________________________   First MD Initiated Contact with Patient 03/25/20 2320     (approximate)  I have reviewed the triage vital signs and the nursing notes.   HISTORY  Chief Complaint Covid Exposure and Loss of Consciousness    HPI Eric Pierce is a 59 y.o. male with below list of previous medical conditions including confirmed COVID-19 infection yesterday presents to the emergency department secondary to worsening dyspnea cough fever nausea and multiple episodes of vomiting with inability to tolerate p.o.  Patient states that he felt dizzy and had a syncopal episode today as well.  Patient was given 500 mL of normal saline from EMS and 4 mg of IV Zofran.        Past Medical History:  Diagnosis Date  . Anxiety   . Meniere disease   . Post-operative nausea and vomiting     Patient Active Problem List   Diagnosis Date Noted  . HTN (hypertension) 02/21/2020  . Meniere disease 04/19/2011  . HYPERLIPIDEMIA 08/17/2007  . Anxiety state 11/01/2006    Past Surgical History:  Procedure Laterality Date  . ENDOLYMPHATIC [facility] OPERATION  02/2011 and 2013   ENT - Dr. Joneen Roach  . MASTOIDECTOMY  2013  . SHOULDER SURGERY  04/2002   left    Prior to Admission medications   Medication Sig Start Date End Date Taking? Authorizing Provider  aspirin (ASPIRIN LOW DOSE) 81 MG tablet Take 81 mg by mouth daily.      [provider]  buPROPion (WELLBUTRIN XL) 300 MG 24 hr tablet Take 1qd (Plz schedule virtual visit for future fills) 08/02/19   Dianne Dun, MD  diazepam (VALIUM) 5 MG tablet Take 1 tablet by mouth as needed. 10/15/13   [provider]  diphenhydrAMINE (BENADRYL) 25 MG tablet Take 25 mg by mouth at bedtime.    [provider]  ezetimibe-simvastatin (VYTORIN) 10-20 MG tablet TAKE 1 TABLET BY MOUTH EVERYDAY AT BEDTIME 07/17/19   Dianne Dun, MD    lisinopril (ZESTRIL) 10 MG tablet Take 1 tablet (10 mg total) by mouth daily. 02/21/20   Lorre Munroe, NP  meclizine (ANTIVERT) 25 MG tablet Take 25 mg by mouth as needed for dizziness.    [provider]  montelukast (SINGULAIR) 10 MG tablet Take by mouth. 05/22/19   [provider]  Omega-3 Fatty Acids (FISH OIL) 1000 MG CAPS Take 2 capsules by mouth daily.     [provider]  ondansetron (ZOFRAN ODT) 4 MG disintegrating tablet Take 1 tablet (4 mg total) by mouth every 8 (eight) hours as needed for nausea or vomiting. 03/24/20   Lorre Munroe, NP  triamterene-hydrochlorothiazide (DYAZIDE) 37.5-25 MG per capsule Take 1 capsule by mouth daily. 10/23/13   [provider]    Allergies Hydrocodone and Morphine and related  Family History  Problem Relation Age of Onset  . Heart disease Father   . Cancer Father        prostate  . Heart disease Brother   . Colon cancer Paternal Grandmother   . Prostate cancer Paternal Grandfather     Social History Social History   Tobacco Use  . Smoking status: Never Smoker  . Smokeless tobacco: Never Used  Substance Use Topics  . Alcohol use: No    Alcohol/week: 0.0 standard drinks  . Drug use: No    Review of Systems Constitutional: Positive for fever/chills Eyes: No visual changes. ENT:  No sore throat. Cardiovascular: Denies chest pain. Respiratory: Denies shortness of breath.  Positive for cough Gastrointestinal: No abdominal pain.  Positive for nausea vomiting.  No diarrhea no constipation. Genitourinary: Negative for dysuria. Musculoskeletal: Negative for neck pain.  Negative for back pain. Integumentary: Negative for rash. Neurological: Negative for headaches, focal weakness or numbness.  ____________________________________________   PHYSICAL EXAM:  VITAL SIGNS: ED Triage Vitals  Enc Vitals Group     BP 03/25/20 1356 127/78     Pulse Rate 03/25/20 1356 86     Resp 03/25/20 1356 18      Temp 03/25/20 1356 98.5 F (36.9 C)     Temp Source 03/25/20 1356 Oral     SpO2 03/25/20 1356 97 %     Weight 03/25/20 1358 106.6 kg (235 lb)     Height 03/25/20 1358 1.803 m (5\' 11" )     Head Circumference --      Peak Flow --      Pain Score 03/25/20 1358 0     Pain Loc --      Pain Edu? --      Excl. in GC? --     Constitutional: Alert and oriented.  Eyes: Conjunctivae are normal.  Head: Atraumatic. Mouth/Throat: Patient is wearing a mask. Neck: No stridor.  No meningeal signs.   Cardiovascular: Normal rate, regular rhythm. Good peripheral circulation. Grossly normal heart sounds. Respiratory: Normal respiratory effort.  No retractions.  Bibasilar rhonchi Gastrointestinal: Soft and nontender. No distention.  Musculoskeletal: No lower extremity tenderness nor edema. No gross deformities of extremities. Neurologic:  Normal speech and language. No gross focal neurologic deficits are appreciated.  Skin:  Skin is warm, dry and intact. Psychiatric: Mood and affect are normal. Speech and behavior are normal.  ____________________________________________   LABS (all labs ordered are listed, but only abnormal results are displayed)  Labs Reviewed  BASIC METABOLIC PANEL - Abnormal; Notable for the following components:      Result Value   Sodium 119 (*)    Chloride 83 (*)    Glucose, Bld 157 (*)    Calcium 8.6 (*)    All other components within normal limits  CBC - Abnormal; Notable for the following components:   MCHC 37.4 (*)    RDW 11.1 (*)    All other components within normal limits  URINALYSIS, COMPLETE (UACMP) WITH MICROSCOPIC - Abnormal; Notable for the following components:   Color, Urine YELLOW (*)    APPearance HAZY (*)    Ketones, ur 80 (*)    All other components within normal limits  HEPATIC FUNCTION PANEL - Abnormal; Notable for the following components:   ALT 48 (*)    All other components within normal limits  LACTIC ACID, PLASMA  SODIUM, URINE, RANDOM    MAGNESIUM  OSMOLALITY, URINE  OSMOLALITY  CK     ____________________________________________  RADIOLOGY I, [place] Laurali Goddard, personally viewed and evaluated these images (plain radiographs) as part of my medical decision making, as well as reviewing the written report by the radiologist.  ED MD interpretation: Basilar opacities.  Official radiology report(s): DG Chest 2 View  Result Date: 03/25/2020 CLINICAL DATA:  Syncope, COVID. EXAM: CHEST - 2 VIEW COMPARISON:  06/21/2009 chest radiograph and prior. FINDINGS: The heart size and mediastinal contours are within normal limits. Left basilar patchy opacities. No pneumothorax or pleural effusion. No acute osseous abnormality. IMPRESSION: Minimal patchy left basilar opacities. Electronically Signed   By: 06/23/2009 M.D.   On:  03/25/2020 14:57     Procedures  ED ECG REPORT I, [place] Adryanna Friedt, the attending physician, personally viewed and interpreted this ECG.   Date: 03/25/2020  EKG Time: 2:09 PM  Rate: 91  Rhythm: Normal sinus rhythm  Axis: Normal  Intervals: Normal  ST&T Change: None  ____________________________________________   INITIAL IMPRESSION / MDM / ASSESSMENT AND PLAN / ED COURSE  As part of my medical decision making, I reviewed the following data within the electronic MEDICAL RECORD NUMBER   59 year old male presented with above-stated history and physical exam consistent with COVID-19 infection concerns for dehydration.  Laboratory data revealed urinalysis with 80 ketones, sodium 119 kidney function normal.  Patient given an additional 2 L of IV normal saline.  Given complaint of worsening dyspnea Decadron 10 mg IV administered.  Remdesivir ordered.  Plan to admit the patient to the hospital staff  ____________________________________________  FINAL CLINICAL IMPRESSION(S) / ED DIAGNOSES  Final diagnoses:  Hyponatremia  COVID-19 virus infection     MEDICATIONS GIVEN DURING THIS  VISIT:  Medications  sodium chloride 0.9 % bolus 1,000 mL (has no administration in time range)  sodium chloride 0.9 % bolus 1,000 mL (has no administration in time range)  dexamethasone (DECADRON) injection 10 mg (has no administration in time range)  sodium chloride 0.9 % bolus 1,000 mL (0 mLs Intravenous Stopped 03/25/20 1523)  ondansetron (ZOFRAN) injection 4 mg (4 mg Intravenous Given 03/25/20 1424)     ED Discharge Orders    None      *Please note:  BODEE LAFOE was evaluated in Emergency Department on 03/25/2020 for the symptoms described in the history of present illness. He was evaluated in the context of the global COVID-19 pandemic, which necessitated consideration that the patient might be at risk for infection with the SARS-CoV-2 virus that causes COVID-19. Institutional protocols and algorithms that pertain to the evaluation of patients at risk for COVID-19 are in a state of rapid change based on information released by regulatory bodies including the CDC and federal and state organizations. These policies and algorithms were followed during the patient's care in the ED.  Some ED evaluations and interventions may be delayed as a result of limited staffing during and after the pandemic.*  Note:  This document was prepared using Dragon voice recognition software and may include unintentional dictation errors.   Darci Current, MD 03/26/20 0107    Darci Current, MD 04/18/20 [phone]

## 2020-03-25 NOTE — ED Triage Notes (Signed)
Pt in via EMS from home with c/o SOB. Pt had a + COVID test yesterday and is having cough, fever nausea and vomiting. Pt reports today he was dizzy and had a syncopal episode. Pt has been given of fluid and 4mg  of Zofran. Pt denies CP and denies SOB. Pt with # 18 to left [facility]

## 2020-03-25 NOTE — ED Triage Notes (Signed)
Read first nurse note, pt has been having N/V since Friday, had a fever for 1-2 days only. A small cough. States he has a syncopal episode today. Denies any injury from fall.

## 2020-03-25 NOTE — Progress Notes (Signed)
Remdesivir - Pharmacy Brief Note    A/P:  Remdesivir 200 mg IVPB once followed by 100 mg IVPB daily x 4 days.   Valrie Hart, PharmD Clinical Pharmacist   03/25/2020 11:51 PM

## 2020-03-26 ENCOUNTER — Ambulatory Visit (HOSPITAL_COMMUNITY): Payer: BC Managed Care – PPO

## 2020-03-26 DIAGNOSIS — J1282 Pneumonia due to coronavirus disease 2019: Secondary | ICD-10-CM | POA: Diagnosis present

## 2020-03-26 DIAGNOSIS — R55 Syncope and collapse: Secondary | ICD-10-CM | POA: Diagnosis present

## 2020-03-26 DIAGNOSIS — I10 Essential (primary) hypertension: Secondary | ICD-10-CM | POA: Diagnosis present

## 2020-03-26 DIAGNOSIS — E860 Dehydration: Secondary | ICD-10-CM | POA: Diagnosis present

## 2020-03-26 DIAGNOSIS — E871 Hypo-osmolality and hyponatremia: Secondary | ICD-10-CM | POA: Diagnosis present

## 2020-03-26 DIAGNOSIS — N401 Enlarged prostate with lower urinary tract symptoms: Secondary | ICD-10-CM | POA: Diagnosis not present

## 2020-03-26 DIAGNOSIS — U071 COVID-19: Principal | ICD-10-CM

## 2020-03-26 DIAGNOSIS — E785 Hyperlipidemia, unspecified: Secondary | ICD-10-CM | POA: Diagnosis present

## 2020-03-26 DIAGNOSIS — Z7982 Long term (current) use of aspirin: Secondary | ICD-10-CM | POA: Diagnosis not present

## 2020-03-26 DIAGNOSIS — Z8249 Family history of ischemic heart disease and other diseases of the circulatory system: Secondary | ICD-10-CM | POA: Diagnosis not present

## 2020-03-26 DIAGNOSIS — F329 Major depressive disorder, single episode, unspecified: Secondary | ICD-10-CM | POA: Diagnosis present

## 2020-03-26 DIAGNOSIS — H8103 Meniere's disease, bilateral: Secondary | ICD-10-CM | POA: Diagnosis not present

## 2020-03-26 DIAGNOSIS — F411 Generalized anxiety disorder: Secondary | ICD-10-CM | POA: Diagnosis present

## 2020-03-26 DIAGNOSIS — Z79899 Other long term (current) drug therapy: Secondary | ICD-10-CM | POA: Diagnosis not present

## 2020-03-26 LAB — NA AND K (SODIUM & POTASSIUM), RAND UR
Potassium Urine: 7 mmol/L
Sodium, Ur: 22 mmol/L

## 2020-03-26 LAB — BASIC METABOLIC PANEL
Anion gap: 9 (ref 5–15)
BUN: 10 mg/dL (ref 6–20)
CO2: 22 mmol/L (ref 22–32)
Calcium: 8.4 mg/dL — ABNORMAL LOW (ref 8.9–10.3)
Chloride: 98 mmol/L (ref 98–111)
Creatinine, Ser: 0.78 mg/dL (ref 0.61–1.24)
GFR calc Af Amer: >60 mL/min (ref >60)
GFR calc non Af Amer: >60 mL/min (ref >60)
Glucose, Bld: 140 mg/dL — ABNORMAL HIGH (ref 70–99)
Potassium: 4.3 mmol/L (ref 3.5–5.1)
Sodium: 129 mmol/L — ABNORMAL LOW (ref 135–145)

## 2020-03-26 LAB — SARS CORONAVIRUS 2 BY RT PCR (HOSPITAL ORDER, PERFORMED IN ~~LOC~~ HOSPITAL LAB): SARS Coronavirus 2: POSITIVE — AB

## 2020-03-26 LAB — HIV ANTIBODY (ROUTINE TESTING W REFLEX): HIV Screen 4th Generation wRfx: NONREACTIVE

## 2020-03-26 LAB — OSMOLALITY, URINE: Osmolality, Ur: 649 mOsm/kg (ref 300–900)

## 2020-03-26 LAB — TSH: TSH: 0.494 u[IU]/mL (ref 0.350–4.500)

## 2020-03-26 LAB — CK: Total CK: 210 U/L (ref 49–397)

## 2020-03-26 LAB — OSMOLALITY: Osmolality: 277 mOsm/kg (ref 275–295)

## 2020-03-26 MED ORDER — EZETIMIBE 10 MG PO TABS
10.0000 mg | ORAL_TABLET | Freq: Every day | ORAL | Status: DC
  Filled 2020-03-26 (×2): qty 1

## 2020-03-26 MED ORDER — SODIUM CHLORIDE 0.9 % IV SOLN: INTRAVENOUS | Status: DC

## 2020-03-26 MED ORDER — SODIUM CHLORIDE 0.9 % IV SOLN: 100.0000 mg | Freq: Every day | INTRAVENOUS | Status: DC

## 2020-03-26 MED ORDER — DIPHENHYDRAMINE HCL 25 MG PO CAPS
25.0000 mg | ORAL_CAPSULE | Freq: Every day | ORAL | Status: DC
  Administered 2020-03-26: 25 mg via ORAL
  Filled 2020-03-26: qty 1

## 2020-03-26 MED ORDER — BARICITINIB 2 MG PO TABS
4.0000 mg | ORAL_TABLET | Freq: Every day | ORAL | Status: DC
  Administered 2020-03-26 – 2020-03-27 (×2): 4 mg via ORAL
  Filled 2020-03-26 (×2): qty 2

## 2020-03-26 MED ORDER — POTASSIUM CHLORIDE 20 MEQ PO PACK
40.0000 meq | PACK | Freq: Once | ORAL | Status: AC
  Administered 2020-03-26: 05:00:00 40 meq via ORAL
  Filled 2020-03-26: qty 2

## 2020-03-26 MED ORDER — SODIUM CHLORIDE 0.9 % IV SOLN: 200.0000 mg | Freq: Once | INTRAVENOUS | Status: DC

## 2020-03-26 MED ORDER — ONDANSETRON HCL 4 MG PO TABS: 4.0000 mg | ORAL_TABLET | Freq: Four times a day (QID) | ORAL | Status: DC | PRN

## 2020-03-26 MED ORDER — FAMOTIDINE 20 MG PO TABS
20.0000 mg | ORAL_TABLET | Freq: Two times a day (BID) | ORAL | Status: DC
  Administered 2020-03-26 – 2020-03-27 (×3): 20 mg via ORAL
  Filled 2020-03-26 (×3): qty 1

## 2020-03-26 MED ORDER — ONDANSETRON HCL 4 MG/2ML IJ SOLN: 4.0000 mg | Freq: Four times a day (QID) | INTRAMUSCULAR | Status: DC | PRN

## 2020-03-26 MED ORDER — LISINOPRIL 10 MG PO TABS
10.0000 mg | ORAL_TABLET | Freq: Every day | ORAL | Status: DC
  Administered 2020-03-27: 10 mg via ORAL
  Filled 2020-03-26 (×2): qty 1

## 2020-03-26 MED ORDER — DIAZEPAM 5 MG PO TABS: 5.0000 mg | ORAL_TABLET | Freq: Three times a day (TID) | ORAL | Status: DC | PRN

## 2020-03-26 MED ORDER — TRIAMTERENE-HCTZ 37.5-25 MG PO TABS
1.0000 | ORAL_TABLET | Freq: Every day | ORAL | Status: DC
  Administered 2020-03-26 – 2020-03-27 (×2): 1 via ORAL
  Filled 2020-03-26 (×3): qty 1

## 2020-03-26 MED ORDER — ONDANSETRON 4 MG PO TBDP
4.0000 mg | ORAL_TABLET | Freq: Three times a day (TID) | ORAL | Status: DC | PRN
  Filled 2020-03-26: qty 1

## 2020-03-26 MED ORDER — EZETIMIBE-SIMVASTATIN 10-20 MG PO TABS: 1.0000 | ORAL_TABLET | Freq: Every day | ORAL | Status: DC

## 2020-03-26 MED ORDER — TRAZODONE HCL 50 MG PO TABS: 25.0000 mg | ORAL_TABLET | Freq: Every evening | ORAL | Status: DC | PRN

## 2020-03-26 MED ORDER — MONTELUKAST SODIUM 10 MG PO TABS
10.0000 mg | ORAL_TABLET | Freq: Every day | ORAL | Status: DC
  Administered 2020-03-26: 22:00:00 10 mg via ORAL
  Filled 2020-03-26: qty 1

## 2020-03-26 MED ORDER — GUAIFENESIN-DM 100-10 MG/5ML PO SYRP: 10.0000 mL | ORAL_SOLUTION | ORAL | Status: DC | PRN

## 2020-03-26 MED ORDER — MAGNESIUM HYDROXIDE 400 MG/5ML PO SUSP
30.0000 mL | Freq: Every day | ORAL | Status: DC | PRN
  Filled 2020-03-26: qty 30

## 2020-03-26 MED ORDER — MAGNESIUM SULFATE 2 GM/50ML IV SOLN
2.0000 g | Freq: Once | INTRAVENOUS | Status: AC
  Administered 2020-03-26: 06:00:00 2 g via INTRAVENOUS
  Filled 2020-03-26: qty 50

## 2020-03-26 MED ORDER — ZINC SULFATE 220 (50 ZN) MG PO CAPS
220.0000 mg | ORAL_CAPSULE | Freq: Every day | ORAL | Status: DC
  Administered 2020-03-26 – 2020-03-27 (×2): 220 mg via ORAL
  Filled 2020-03-26 (×2): qty 1

## 2020-03-26 MED ORDER — DEXAMETHASONE SODIUM PHOSPHATE 10 MG/ML IJ SOLN: 6.0000 mg | INTRAMUSCULAR | Status: DC

## 2020-03-26 MED ORDER — VITAMIN D 25 MCG (1000 UNIT) PO TABS
1000.0000 [IU] | ORAL_TABLET | Freq: Every day | ORAL | Status: DC
  Administered 2020-03-26 – 2020-03-27 (×2): 1000 [IU] via ORAL
  Filled 2020-03-26 (×2): qty 1

## 2020-03-26 MED ORDER — DEXAMETHASONE SODIUM PHOSPHATE 10 MG/ML IJ SOLN
6.0000 mg | Freq: Two times a day (BID) | INTRAMUSCULAR | Status: DC
  Administered 2020-03-26 – 2020-03-27 (×3): 6 mg via INTRAVENOUS
  Filled 2020-03-26 (×3): qty 1

## 2020-03-26 MED ORDER — OMEGA-3-ACID ETHYL ESTERS 1 G PO CAPS
1.0000 g | ORAL_CAPSULE | Freq: Two times a day (BID) | ORAL | Status: DC
  Administered 2020-03-26 – 2020-03-27 (×3): 1 g via ORAL
  Filled 2020-03-26 (×3): qty 1

## 2020-03-26 MED ORDER — GUAIFENESIN ER 600 MG PO TB12
600.0000 mg | ORAL_TABLET | Freq: Two times a day (BID) | ORAL | Status: DC
  Administered 2020-03-26 – 2020-03-27 (×3): 600 mg via ORAL
  Filled 2020-03-26 (×3): qty 1

## 2020-03-26 MED ORDER — MECLIZINE HCL 25 MG PO TABS
25.0000 mg | ORAL_TABLET | ORAL | Status: DC | PRN
  Filled 2020-03-26: qty 1

## 2020-03-26 MED ORDER — BUPROPION HCL ER (XL) 150 MG PO TB24
300.0000 mg | ORAL_TABLET | Freq: Every day | ORAL | Status: DC
  Administered 2020-03-26 – 2020-03-27 (×2): 300 mg via ORAL
  Filled 2020-03-26 (×2): qty 2

## 2020-03-26 MED ORDER — ASPIRIN EC 81 MG PO TBEC
81.0000 mg | DELAYED_RELEASE_TABLET | Freq: Every day | ORAL | Status: DC
  Administered 2020-03-26 – 2020-03-27 (×2): 81 mg via ORAL
  Filled 2020-03-26 (×2): qty 1

## 2020-03-26 MED ORDER — ASPIRIN LOW DOSE 81 MG PO TABS: 81.0000 mg | ORAL_TABLET | Freq: Every day | ORAL | Status: DC

## 2020-03-26 MED ORDER — ENOXAPARIN SODIUM 40 MG/0.4ML ~~LOC~~ SOLN
40.0000 mg | SUBCUTANEOUS | Status: DC
  Administered 2020-03-26 – 2020-03-27 (×2): 40 mg via SUBCUTANEOUS
  Filled 2020-03-26 (×2): qty 0.4

## 2020-03-26 MED ORDER — ASCORBIC ACID 500 MG PO TABS
500.0000 mg | ORAL_TABLET | Freq: Every day | ORAL | Status: DC
  Administered 2020-03-26 – 2020-03-27 (×2): 500 mg via ORAL
  Filled 2020-03-26 (×2): qty 1

## 2020-03-26 MED ORDER — SIMVASTATIN 20 MG PO TABS: 20.0000 mg | ORAL_TABLET | Freq: Every day | ORAL | Status: DC

## 2020-03-26 NOTE — Progress Notes (Signed)
Confirmed positive test from [facility] health department done on 03/24/20.

## 2020-03-26 NOTE — ED Notes (Signed)
Pt repositioned in bed at this time by this RN. Patient given warm blankets and pillow to be comfortable. No further needs noted, will continue to monitor.

## 2020-03-26 NOTE — H&P (Addendum)
[place]   PATIENT NAME: Eric Pierce    MR#:  [id number]  DATE OF BIRTH:  [birth year]  DATE OF ADMISSION:  03/25/2020  PRIMARY CARE PHYSICIAN: Lorre Munroe, NP   REQUESTING/REFERRING PHYSICIAN: Bayard Males, MD  CHIEF COMPLAINT:   Chief Complaint  Patient presents with  . Covid Exposure  . Loss of Consciousness    HISTORY OF PRESENT ILLNESS:  Eric Pierce  is a 59 y.o. Caucasian male with a known history of Menire's disease and anxiety, who presented to the emergency room with acute onset of dizziness and lightheadedness 12 episodes x2 yesterday.  He said that he was walking in the kitchen before he passed out.  He denies any head injuries.  No paresthesias or focal muscle weakness.  No chest pain or palpitations.  He stated that he has not slept in 3 days as he has been having recurrent nausea and vomiting as well as diarrhea.  10 admits to body aches and had fever and chills on Friday and Saturday.  He tested positive for COVID-19 on Monday.  He admitted to loss of taste and smell.  Is also been having dry cough occasionally productive of whitish sputum.  No significant dyspnea or wheezing.  Upon presentation to the emergency room blood pressure was 140/90 with otherwise normal vital signs.  Labs revealed hyponatremia 119 with hypochloremia of 83 magnesium of 1.7 with potassium of 3.5, CK of 210 unremarkable CBC and lactic acid was 1.  Urinalysis was unremarkable.  Urine osmolality was 649 urine sodium was 143 2 view chest x-ray showed minimal patchy left basal opacities.  EKG showed normal sinus rhythm with a rate of 91.  The patient was given IV Decadron, IV Zofran, IV remdesivir and 3 L bolus of IV normal saline.  He will be admitted to a medical monitored isolation bed for further evaluation and management.  PAST MEDICAL HISTORY:   Past Medical History:  Diagnosis Date  . Anxiety   . Meniere disease   . Post-operative nausea and vomiting    Hypertension Dyslipidemia Depression and anxiety.  PAST SURGICAL HISTORY:   Past Surgical History:  Procedure Laterality Date  . ENDOLYMPHATIC [facility] OPERATION  02/2011 and 2013   ENT - Dr. Joneen Roach  . MASTOIDECTOMY  2013  . SHOULDER SURGERY  04/2002   left    SOCIAL HISTORY:   Social History   Tobacco Use  . Smoking status: Never Smoker  . Smokeless tobacco: Never Used  Substance Use Topics  . Alcohol use: No    Alcohol/week: 0.0 standard drinks    FAMILY HISTORY:   Family History  Problem Relation Age of Onset  . Heart disease Father   . Cancer Father        prostate  . Heart disease Brother   . Colon cancer Paternal Grandmother   . Prostate cancer Paternal Grandfather     DRUG ALLERGIES:   Allergies  Allergen Reactions  . Hydrocodone     Severe vomiting  . Morphine And Related     Caused nausea and vomiting    REVIEW OF SYSTEMS:   ROS As per history of present illness. All pertinent systems were reviewed above. Constitutional, HEENT, cardiovascular, respiratory, GI, GU, musculoskeletal, neuro, psychiatric, endocrine, integumentary and hematologic systems were reviewed and are otherwise negative/unremarkable except for positive findings mentioned above in the HPI.   MEDICATIONS AT HOME:   Prior to Admission medications   Medication Sig Start Date End Date Taking?  Authorizing Provider  aspirin (ASPIRIN LOW DOSE) 81 MG tablet Take 81 mg by mouth daily.     Yes [provider]  buPROPion (WELLBUTRIN XL) 300 MG 24 hr tablet Take 1qd (Plz schedule virtual visit for future fills) 08/02/19  Yes Dianne Dun, MD  diazepam (VALIUM) 5 MG tablet Take 1 tablet by mouth every 8 (eight) hours as needed.  10/15/13  Yes [provider]  diphenhydrAMINE (BENADRYL) 25 MG tablet Take 25 mg by mouth at bedtime.   Yes [provider]  ezetimibe-simvastatin (VYTORIN) 10-20 MG tablet TAKE 1 TABLET BY MOUTH EVERYDAY AT BEDTIME 07/17/19  Yes Dianne Dun, MD  lisinopril (ZESTRIL) 10 MG tablet Take 1 tablet (10 mg total) by mouth daily. 02/21/20  Yes Lorre Munroe, NP  meclizine (ANTIVERT) 25 MG tablet Take 25 mg by mouth as needed for dizziness.   Yes [provider]  montelukast (SINGULAIR) 10 MG tablet Take 10 mg by mouth at bedtime.  05/22/19  Yes [provider]  Omega-3 Fatty Acids (FISH OIL) 1000 MG CAPS Take 2 capsules by mouth daily.    Yes [provider]  ondansetron (ZOFRAN ODT) 4 MG disintegrating tablet Take 1 tablet (4 mg total) by mouth every 8 (eight) hours as needed for nausea or vomiting. 03/24/20  Yes Baity, Salvadore Oxford, NP  triamterene-hydrochlorothiazide (DYAZIDE) 37.5-25 MG per capsule Take 1 capsule by mouth daily. 10/23/13  Yes [provider]      VITAL SIGNS:  Blood pressure (!) 150/74, pulse 95, temperature 98.5 F (36.9 C), temperature source Oral, resp. rate 17, height 5\' 11"  (1.803 m), weight 106.6 kg, SpO2 94 %.  PHYSICAL EXAMINATION:  Physical Exam  GENERAL:  59 y.o.-year-old Caucasian male patient lying in the bed with no acute distress.  EYES: Pupils equal, round, reactive to light and accommodation. No scleral icterus. Extraocular muscles intact.  HEENT: Head atraumatic, normocephalic. Oropharynx and nasopharynx clear.  NECK:  Supple, no jugular venous distention. No thyroid enlargement, no tenderness.  LUNGS: Slightly manage bibasal breath sounds with left basal crackles. CARDIOVASCULAR: Regular rate and rhythm, S1, S2 normal. No murmurs, rubs, or gallops.  ABDOMEN: Soft, nondistended, nontender. Bowel sounds present. No organomegaly or mass.  EXTREMITIES: No pedal edema, cyanosis, or clubbing.  NEUROLOGIC: Cranial nerves II through XII are intact. Muscle strength 5/5 in all extremities. Sensation intact. Gait not checked.  PSYCHIATRIC: The patient is alert and oriented x 3.  Normal affect and good eye contact. SKIN: No obvious rash, lesion, or ulcer.   LABORATORY  PANEL:   CBC Recent Labs  Lab 03/25/20 1419  WBC 6.9  HGB 16.4  HCT 43.9  PLT 192   ------------------------------------------------------------------------------------------------------------------  Chemistries  Recent Labs  Lab 03/25/20 1419  NA 119*  K 3.5  CL 83*  CO2 24  GLUCOSE 157*  BUN 12  CREATININE 0.68  CALCIUM 8.6*  MG 1.7  AST 35  ALT 48*  ALKPHOS 80  BILITOT 1.0   ------------------------------------------------------------------------------------------------------------------  Cardiac Enzymes No results for input(s): TROPONINI in the last 168 hours. ------------------------------------------------------------------------------------------------------------------  RADIOLOGY:  DG Chest 2 View  Result Date: 03/25/2020 CLINICAL DATA:  Syncope, COVID. EXAM: CHEST - 2 VIEW COMPARISON:  06/21/2009 chest radiograph and prior. FINDINGS: The heart size and mediastinal contours are within normal limits. Left basilar patchy opacities. No pneumothorax or pleural effusion. No acute osseous abnormality. IMPRESSION: Minimal patchy left basilar opacities. Electronically Signed   By: 06/23/2009 M.D.   On: 03/25/2020  14:57      IMPRESSION AND PLAN:   1.  Symptomatic hyponatremia with associated volume depletion and syncope.  Differential diagnoses would include orthostatic hypotension and arrhythmias and less likely hypoglycemia as well as neurally mediated and cardiogenic syncope. -The patient will be admitted to a medically monitored isolation bed. -He will be hydrated with IV normal saline. -We will follow serial BMPs. -Hyponatremia work-up will also be obtained. -We will check orthostatics. -He will be monitored for arrhythmias.  2.  Pneumonia due to COVID-19. -Patient will be placed on IV remdesivir. -We will add IV Decadron. -I discussed baricitinib with the patient and he agreed to proceed with it. -She will be on vitamin C, vitamin D, Zinc  sulfate and aspirin. -Mucolytic therapy will be provided.  3.  Essential hypertension. -We will continue Zestril.  4.  Dyslipidemia. -We will continue Vytorin and omega-3 fatty acids as well as Zetia..  5.  Depression and anxiety. -We will continue his Wellbutrin XL and Valium.  6.  DVT prophylaxis. -Subcutaneous Lovenox.  All the records are reviewed and case discussed with ED provider. The plan of care was discussed in details with the patient (and family). I answered all questions. The patient agreed to proceed with the above mentioned plan. Further management will depend upon hospital course.   CODE STATUS: Full code  Status is: Inpatient  Remains inpatient appropriate because:Altered mental status, Ongoing diagnostic testing needed not appropriate for outpatient work up, Unsafe d/c plan, IV treatments appropriate due to intensity of illness or inability to take PO and Inpatient level of care appropriate due to severity of illness   Dispo: The patient is from: Home              Anticipated d/c is to: Home              Anticipated d/c date is: 3 days              Patient currently is not medically stable to d/c.    TOTAL TIME TAKING CARE OF THIS PATIENT: 55 minutes.    Hannah Beat M.D on 03/26/2020 at 3:22 AM  [facility]   From 7 PM-7 AM, contact night-coverage [email or web address]  CC: Primary care physician; Lorre Munroe, NP   Note: This dictation was prepared with Dragon dictation along with smaller phrase technology. Any transcriptional typo errors that result from this process are unintentional.

## 2020-03-26 NOTE — Progress Notes (Signed)
   03/26/20 1352  Family/Significant Other Communication  Family/Significant Other Update Called (updated wife all questions answered)

## 2020-03-26 NOTE — Progress Notes (Signed)
PROGRESS NOTE    Patient: Danae Chen                            PCP: Lorre Munroe, NP                    DOB: 12/21/1960            DOA: 03/25/2020 PXT:[id number]             DOS: 03/26/2020, 3:15 PM   LOS: 0 days   Date of Service: The patient was seen and examined on 03/26/2020  Subjective:   The patient was seen and examined this morning, stable, mild shortness of breath, worsening with exertion, dry persistent cough... Otherwise stable on room air addressed Denies any chest pain.  Brief Narrative:   Jerrit Horen  is a 59 y.o. Caucasian male with a known history of Menire's disease and anxiety, who presented to the emergency room with acute onset of dizziness and lightheadedness 12 episodes x2 yesterday.  He said that he was walking in the kitchen before he passed out.  He denies any head injuries.  No paresthesias or focal muscle weakness.  No chest pain or palpitations.  He stated that he has not slept in 3 days as he has been having recurrent nausea and vomiting as well as diarrhea.  8 admits to body aches and had fever and chills on Friday and Saturday.  He tested positive for COVID-19 on Monday.  He admitted to loss of taste and smell.  Is also been having dry cough occasionally productive of whitish sputum.  No significant dyspnea or wheezing.  ED Hemodynamically stable, labs within normal limits with exception of sodium of 119, potassium 3.5, CK of 210 lactic acid 1.0 Urine osmolality was 649 urine sodium was 143 2 view  chest x-ray showed minimal patchy left basal opacities.   EKG showed normal sinus rhythm with a rate of 91.  The patient was given IV Decadron, IV Zofran, IV remdesivir and 3 L bolus of IV normal saline.  He will be admitted to a medical monitored isolation bed for further evaluation and management.   Assessment & Plan:   Active Problems:   Hyponatremia   Symptomatic hyponatremia w volume depletion, syncope -Patient has been admitted to Covid  unit, under close observation -Hyponatremia likely exacerbated by diuretics Lasix and lisinopril along with nausea vomiting,  poor p.o. intake in past few days.  -We will continue with gentle IV fluid hydration normal saline -Monitoring sodium level closely, 119 on admission >>>  -Likely preazotemia as above  -Hyponatremia work-up will also be obtained. -Minimally orthostatic, continue to reevaluate -Monitoring for arrhythmia, currently stable  Pneumonia due to COVID-19. In no respiratory distress, shortness of breath with exertion, persistent dry cough -Along with diffuse rhonchi -Continue IV remdesivir, IV Decadron -Follow-up with inflammatory markers -Agreeable to be treated with  baricitinib  -She will be on vitamin C, vitamin D, Zinc sulfate and aspirin. -Mucolytic therapy will be provided.   Essential hypertension. -Currently stable, with holding diuretics and lisinopril --due to hyponatremia dehydration  History of chronic Mnire's disease -Stable -Treated with diuretics, as an outpatient will hold at this time     Dyslipidemia. -Stable, LDL 111 -We will continue Vytorin and omega-3 fatty acids as well as Zetia..  Depression and anxiety. -We will continue his Wellbutrin XL and Valium. -Stable   DVT prophylaxis. -Subcutaneous Lovenox. CODE STATUS: Full  code Family communication, not discussed with patient  Admission status:    Status is: Inpatient  Remains inpatient appropriate because:Inpatient level of care appropriate due to severity of illness   Dispo: The patient is from: Home              Anticipated d/c is to: Home              Anticipated d/c date is: 3 days              Patient currently is not medically stable to d/c.        Procedures:   No admission procedures for hospital encounter.     Antimicrobials:  Anti-infectives (From admission, onward)   Start     Dose/Rate Route Frequency Ordered Stop   03/27/20 1000  remdesivir  100 mg in sodium chloride 0.9 % 100 mL IVPB       "Followed by" Linked Group Details   100 mg 200 mL/hr over 30 Minutes Intravenous Daily 03/25/20 2351 03/31/20 0959   03/27/20 1000  remdesivir 100 mg in sodium chloride 0.9 % 100 mL IVPB  Status:  Discontinued       "Followed by" Linked Group Details   100 mg 200 mL/hr over 30 Minutes Intravenous Daily 03/26/20 0216 03/26/20 0229   03/26/20 0230  remdesivir 200 mg in sodium chloride 0.9% 250 mL IVPB  Status:  Discontinued       "Followed by" Linked Group Details   200 mg 580 mL/hr over 30 Minutes Intravenous Once 03/26/20 0216 03/26/20 0229   03/26/20 0130  remdesivir 200 mg in sodium chloride 0.9% 250 mL IVPB       "Followed by" Linked Group Details   200 mg 580 mL/hr over 30 Minutes Intravenous Once 03/25/20 2351 03/26/20 0123       Medication:  . vitamin C  500 mg Oral Daily  . aspirin EC  81 mg Oral Daily  . baricitinib  4 mg Oral Daily  . buPROPion  300 mg Oral Daily  . cholecalciferol  1,000 Units Oral Daily  . dexamethasone (DECADRON) injection  6 mg Intravenous Q12H  . diphenhydrAMINE  25 mg Oral QHS  . enoxaparin (LOVENOX) injection  40 mg Subcutaneous Q24H  . ezetimibe  10 mg Oral q1800   And  . simvastatin  20 mg Oral q1800  . famotidine  20 mg Oral BID  . guaiFENesin  600 mg Oral BID  . lisinopril  10 mg Oral Daily  . montelukast  10 mg Oral QHS  . omega-3 acid ethyl esters  1 g Oral BID  . triamterene-hydrochlorothiazide  1 tablet Oral Daily  . zinc sulfate  220 mg Oral Daily    diazepam, guaiFENesin-dextromethorphan, magnesium hydroxide, meclizine, ondansetron **OR** ondansetron (ZOFRAN) IV, ondansetron, traZODone   Objective:   Vitals:   03/26/20 0300 03/26/20 0408 03/26/20 0726 03/26/20 1148  BP: 134/72 136/76 129/81 134/82  Pulse: (!) 102 97 96 99  Resp: 18 17 20 18   Temp:  98.6 F (37 C) 99 F (37.2 C) 98.8 F (37.1 C)  TempSrc:  Oral Oral Oral  SpO2: 96% 99% 98% 97%  Weight:      Height:         Intake/Output Summary (Last 24 hours) at 03/26/2020 1515 Last data filed at 03/26/2020 1141 Gross per 24 hour  Intake 1420 ml  Output 1850 ml  Net -430 ml   Filed Weights   03/25/20 1358  Weight: 106.6 kg  Examination:   Physical Exam  Constitution:  Alert, cooperative, no distress,  Appears calm and comfortable  Psychiatric: Normal and stable mood and affect, cognition intact,   HEENT: Normocephalic, PERRL, otherwise with in Normal limits  Chest:Chest symmetric Cardio vascular:  S1/S2, RRR, No murmure, No Rubs or Gallops  pulmonary: Diffuse rhonchi, negative crackles, unlabored respiratory effort, negative wheezes / crackles Abdomen: Soft, non-tender, non-distended, bowel sounds,no masses, no organomegaly Muscular skeletal: Limited exam - in bed, able to move all 4 extremities, Normal strength,  Neuro: CNII-XII intact. , normal motor and sensation, reflexes intact  Extremities: No pitting edema lower extremities, +2 pulses  Skin: Dry, warm to touch, negative for any Rashes, No open wounds Wounds: per nursing documentation    ------------------------------------------------------------------------------------------------------------------------------------------    LABs:  CBC Latest Ref Rng & Units 03/25/2020 03/06/2020 09/20/2018  WBC 4.0 - 10.5 K/uL 6.9 8.4 7.4  Hemoglobin 13.0 - 17.0 g/dL 28.7 68.1 15.7  Hematocrit 39 - 52 % 43.9 46.9 47.2  Platelets 150 - 400 K/uL 192 248.0 249.0   CMP Latest Ref Rng & Units 03/25/2020 03/06/2020 09/20/2018  Glucose 70 - 99 mg/dL 262(M) 355(H) 741(U)  BUN 6 - 20 mg/dL 12 16 13   Creatinine 0.61 - 1.24 mg/dL 3.84 5.36  Sodium 135 - 145 mmol/L 119(LL) 136 138  Potassium 3.5 - 5.1 mmol/L 3.5 4.4 4.1  Chloride 98 - 111 mmol/L 83(L) 100 101  CO2 22 - 32 mmol/L 24 29 28   Calcium 8.9 - 10.3 mg/dL 4.68) 9.7 9.5  Total Protein 6.5 - 8.1 g/dL 7.2 7.2 6.9  Total Bilirubin 0.3 - 1.2 mg/dL 1.0 0.7 0.6  Alkaline Phos 38 - 126 U/L 80 86  81  AST 15 - 41 U/L 35 22 25  ALT 0 - 44 U/L 48(H) 42 48       Micro Results No results found for this or any previous visit (from the past 240 hour(s)).  Radiology Reports DG Chest 2 View  Result Date: 03/25/2020 CLINICAL DATA:  Syncope, COVID. EXAM: CHEST - 2 VIEW COMPARISON:  06/21/2009 chest radiograph and prior. FINDINGS: The heart size and mediastinal contours are within normal limits. Left basilar patchy opacities. No pneumothorax or pleural effusion. No acute osseous abnormality. IMPRESSION: Minimal patchy left basilar opacities. Electronically Signed   By: 03/27/2020 M.D.   On: 03/25/2020 14:57    SIGNED: Stana Bunting, MD, FACP, FHM. [facility],  Pager (please use [email or web address] to page/text)  If 7PM-7AM, please contact night-coverage [email or web address] [facility] 03/26/2020, 3:15 PM

## 2020-03-27 DIAGNOSIS — H8103 Meniere's disease, bilateral: Secondary | ICD-10-CM

## 2020-03-27 DIAGNOSIS — F329 Major depressive disorder, single episode, unspecified: Secondary | ICD-10-CM

## 2020-03-27 DIAGNOSIS — F419 Anxiety disorder, unspecified: Secondary | ICD-10-CM

## 2020-03-27 DIAGNOSIS — N401 Enlarged prostate with lower urinary tract symptoms: Secondary | ICD-10-CM

## 2020-03-27 DIAGNOSIS — R3912 Poor urinary stream: Secondary | ICD-10-CM

## 2020-03-27 DIAGNOSIS — E785 Hyperlipidemia, unspecified: Secondary | ICD-10-CM

## 2020-03-27 LAB — CBC WITH DIFFERENTIAL/PLATELET
Abs Immature Granulocytes: 0.03 10*3/uL (ref 0.00–0.07)
Basophils Absolute: 0 10*3/uL (ref 0.0–0.1)
Basophils Relative: 0 %
Eosinophils Absolute: 0 10*3/uL (ref 0.0–0.5)
Eosinophils Relative: 0 %
HCT: 39.2 % (ref 39.0–52.0)
Hemoglobin: 14.4 g/dL (ref 13.0–17.0)
Immature Granulocytes: 0 %
Lymphocytes Relative: 15 %
Lymphs Abs: 1.3 10*3/uL (ref 0.7–4.0)
MCH: 31.9 pg (ref 26.0–34.0)
MCHC: 36.7 g/dL — ABNORMAL HIGH (ref 30.0–36.0)
MCV: 86.7 fL (ref 80.0–100.0)
Monocytes Absolute: 0.9 10*3/uL (ref 0.1–1.0)
Monocytes Relative: 10 %
Neutro Abs: 6.6 10*3/uL (ref 1.7–7.7)
Neutrophils Relative %: 75 %
Platelets: 165 10*3/uL (ref 150–400)
RBC: 4.52 MIL/uL (ref 4.22–5.81)
RDW: 11.6 % (ref 11.5–15.5)
WBC: 8.8 10*3/uL (ref 4.0–10.5)
nRBC: 0 % (ref 0.0–0.2)

## 2020-03-27 LAB — COMPREHENSIVE METABOLIC PANEL
ALT: 34 U/L (ref 0–44)
AST: 24 U/L (ref 15–41)
Albumin: 3.4 g/dL — ABNORMAL LOW (ref 3.5–5.0)
Alkaline Phosphatase: 61 U/L (ref 38–126)
Anion gap: 4 — ABNORMAL LOW (ref 5–15)
BUN: 11 mg/dL (ref 6–20)
CO2: 27 mmol/L (ref 22–32)
Calcium: 8.3 mg/dL — ABNORMAL LOW (ref 8.9–10.3)
Chloride: 102 mmol/L (ref 98–111)
Creatinine, Ser: 0.72 mg/dL (ref 0.61–1.24)
GFR calc Af Amer: >60 mL/min (ref >60)
GFR calc non Af Amer: >60 mL/min (ref >60)
Glucose, Bld: 139 mg/dL — ABNORMAL HIGH (ref 70–99)
Potassium: 4.2 mmol/L (ref 3.5–5.1)
Sodium: 133 mmol/L — ABNORMAL LOW (ref 135–145)
Total Bilirubin: 0.6 mg/dL (ref 0.3–1.2)
Total Protein: 6.3 g/dL — ABNORMAL LOW (ref 6.5–8.1)

## 2020-03-27 LAB — FERRITIN: Ferritin: 422 ng/mL — ABNORMAL HIGH (ref 24–336)

## 2020-03-27 LAB — C-REACTIVE PROTEIN: CRP: 0.6 mg/dL (ref <1.0)

## 2020-03-27 LAB — FIBRIN DERIVATIVES D-DIMER (ARMC ONLY): Fibrin derivatives D-dimer (ARMC): 390.97 ng/mL (FEU) (ref 0.00–499.00)

## 2020-03-27 MED ORDER — TAMSULOSIN HCL 0.4 MG PO CAPS: 0.4000 mg | ORAL_CAPSULE | Freq: Every day | ORAL | 0 refills | Status: DC

## 2020-03-27 MED ORDER — ASCORBIC ACID 500 MG PO TABS: 500.0000 mg | ORAL_TABLET | Freq: Every day | ORAL | 0 refills | Status: AC

## 2020-03-27 MED ORDER — DEXAMETHASONE 6 MG PO TABS: 6.0000 mg | ORAL_TABLET | Freq: Every day | ORAL | 7 refills | Status: DC

## 2020-03-27 MED ORDER — ALBUTEROL SULFATE HFA 108 (90 BASE) MCG/ACT IN AERS: 2.0000 | INHALATION_SPRAY | Freq: Four times a day (QID) | RESPIRATORY_TRACT | 0 refills | Status: DC

## 2020-03-27 MED ORDER — ALBUTEROL SULFATE HFA 108 (90 BASE) MCG/ACT IN AERS
2.0000 | INHALATION_SPRAY | Freq: Four times a day (QID) | RESPIRATORY_TRACT | Status: DC
  Filled 2020-03-27: qty 6.7

## 2020-03-27 MED ORDER — ZINC SULFATE 220 (50 ZN) MG PO CAPS: 220.0000 mg | ORAL_CAPSULE | Freq: Every day | ORAL | 0 refills | Status: AC

## 2020-03-27 NOTE — Progress Notes (Signed)
Patient scheduled for outpatient Remdesivir infusions at 11am on Friday 9/3, Saturday 9/4, and Sunday 9/5 at [facility]. Please inform the patient to park at [address] [place], [place], as staff will be escorting the patient through the east entrance of the hospital.    There is a wave flag banner located near the entrance on [address]. Turn into this entrance and immediately turn left and park in 1 of the 5 designated Covid Infusion Parking spots. There is a phone number on the sign, please call and let the staff know what spot you are in and we will come out and get you. For questions call [phone].  Thanks.

## 2020-03-27 NOTE — Discharge Summary (Addendum)
[facility] [place] at [facility]   PATIENT NAME: Eric Pierce    MR#:  [id number]  DATE OF BIRTH:  June 18, 1961  DATE OF ADMISSION:  03/25/2020 ADMITTING PHYSICIAN: Hannah Beat, MD  DATE OF DISCHARGE: 03/27/2020 11:46 AM  PRIMARY CARE PHYSICIAN: Lorre Munroe, NP    ADMISSION DIAGNOSIS:  Hyponatremia [E87.1] COVID-19 virus infection [U07.1]  DISCHARGE DIAGNOSIS:  Active Problems:   HYPERLIPIDEMIA   Anxiety state   Meniere disease   HTN (hypertension)   Hyponatremia   COVID-19 virus infection   SECONDARY DIAGNOSIS:   Past Medical History:  Diagnosis Date  . Anxiety   . Meniere disease   . Post-operative nausea and vomiting     HOSPITAL COURSE:   1.  Hyponatremia with a sodium of 119 on admission.  Discontinue Maxide which contains hydrochlorothiazide which could be the culprit.  Sodium up to 133 and patient feeling a lot better and wants to go home. 2.  COVID-19 pneumonia.  The patient finished day 2 of remdesivir and was set up for 3 more days of remdesivir as outpatient.  Patient was on IV Decadron and switched over to oral Decadron.  Continue vitamin C vitamin D and zinc. 3.  BPH with patient having difficulty getting his urine out.  Bladder scan was 0 after he urinated.  Will empirically start Flomax. 4.  Mnire's disease.  Stop Dyazide.  Hydrochlorothiazide not a good medication with somebody with low sodium.  As needed meclizine. 5.  Hyperlipidemia on Vytorin 6.  Anxiety on Wellbutrin and Valium  DISCHARGE CONDITIONS:   Satisfactory  CONSULTS OBTAINED:  None  DRUG ALLERGIES:   Allergies  Allergen Reactions  . Hydrocodone     Severe vomiting  . Morphine And Related     Caused nausea and vomiting    DISCHARGE MEDICATIONS:   Allergies as of 03/27/2020      Reactions   Hydrocodone    Severe vomiting   Morphine And Related    Caused nausea and vomiting      Medication List    STOP taking these medications    triamterene-hydrochlorothiazide 37.5-25 MG capsule Commonly known as: DYAZIDE     TAKE these medications   albuterol 108 (90 Base) MCG/ACT inhaler Commonly known as: VENTOLIN HFA Inhale 2 puffs into the lungs every 6 (six) hours.   ascorbic acid 500 MG tablet Commonly known as: VITAMIN C Take 1 tablet (500 mg total) by mouth daily. Start taking on: March 28, 2020   Aspirin Low Dose 81 MG tablet Generic drug: aspirin Take 81 mg by mouth daily.   buPROPion 300 MG 24 hr tablet Commonly known as: WELLBUTRIN XL Take 1qd (Plz schedule virtual visit for future fills)   dexamethasone 6 MG tablet Commonly known as: DECADRON Take 1 tablet (6 mg total) by mouth daily.   diazepam 5 MG tablet Commonly known as: VALIUM Take 1 tablet by mouth every 8 (eight) hours as needed.   diphenhydrAMINE 25 MG tablet Commonly known as: BENADRYL Take 25 mg by mouth at bedtime.   ezetimibe-simvastatin 10-20 MG tablet Commonly known as: VYTORIN TAKE 1 TABLET BY MOUTH EVERYDAY AT BEDTIME   Fish Oil 1000 MG Caps Take 2 capsules by mouth daily.   lisinopril 10 MG tablet Commonly known as: ZESTRIL TAKE 1 TABLET BY MOUTH EVERY DAY   meclizine 25 MG tablet Commonly known as: ANTIVERT Take 25 mg by mouth as needed for dizziness.   montelukast 10 MG tablet Commonly known  as: SINGULAIR Take 10 mg by mouth at bedtime.   ondansetron 4 MG disintegrating tablet Commonly known as: Zofran ODT Take 1 tablet (4 mg total) by mouth every 8 (eight) hours as needed for nausea or vomiting.   tamsulosin 0.4 MG Caps capsule Commonly known as: Flomax Take 1 capsule (0.4 mg total) by mouth daily after supper. Notes to patient: 03/27/2020   zinc sulfate 220 (50 Zn) MG capsule Take 1 capsule (220 mg total) by mouth daily. Start taking on: March 28, 2020        DISCHARGE INSTRUCTIONS:   Follow-up PMD 2 weeks Follow-up outpatient remdesivir infusions  If you experience worsening of your  admission symptoms, develop shortness of breath, life threatening emergency, suicidal or homicidal thoughts you must seek medical attention immediately by calling 911 or calling your MD immediately  if symptoms less severe.  You Must read complete instructions/literature along with all the possible adverse reactions/side effects for all the Medicines you take and that have been prescribed to you. Take any new Medicines after you have completely understood and accept all the possible adverse reactions/side effects.   Please note  You were cared for by a hospitalist during your hospital stay. If you have any questions about your discharge medications or the care you received while you were in the hospital after you are discharged, you can call the unit and asked to speak with the hospitalist on call if the hospitalist that took care of you is not available. Once you are discharged, your primary care physician will handle any further medical issues. Please note that NO REFILLS for any discharge medications will be authorized once you are discharged, as it is imperative that you return to your primary care physician (or establish a relationship with a primary care physician if you do not have one) for your aftercare needs so that they can reassess your need for medications and monitor your lab values.    Today   CHIEF COMPLAINT:   Chief Complaint  Patient presents with  . Covid Exposure  . Loss of Consciousness    HISTORY OF PRESENT ILLNESS:  Eric Pierce  is a 59 y.o. male came in after loss of consciousness and found to have a low sodium.   VITAL SIGNS:  Blood pressure 124/72, pulse 83, temperature 98.8 F (37.1 C), temperature source Oral, resp. rate 18, height 5\' 11"  (1.803 m), weight 106.6 kg, SpO2 99 %.  I/O:    Intake/Output Summary (Last 24 hours) at 03/27/2020 1617 Last data filed at 03/27/2020 0900 Gross per 24 hour  Intake 1935.74 ml  Output 2300 ml  Net -364.26 ml     PHYSICAL EXAMINATION:  GENERAL:  59 y.o.-year-old patient lying in the bed with no acute distress.  EYES: Pupils equal, round, reactive to light and accommodation. No scleral icterus. Extraocular muscles intact.  HEENT: Head atraumatic, normocephalic. Oropharynx and nasopharynx clear.  NECK:  Supple, no jugular venous distention. No thyroid enlargement, no tenderness.  LUNGS: Decreased breath sounds bilaterally, slight expiratory wheezing at bases.no rales,rhonchi or crepitation. No use of accessory muscles of respiration.  CARDIOVASCULAR: S1, S2 normal. No murmurs, rubs, or gallops.  ABDOMEN: Soft, non-tender, non-distended. Bowel sounds present. EXTREMITIES: No pedal edema.  NEUROLOGIC: Cranial nerves II through XII are intact. Muscle strength 5/5 in all extremities. Sensation intact. Gait not checked.  PSYCHIATRIC: The patient is alert.  SKIN: No obvious rash, lesion, or ulcer.   DATA REVIEW:   CBC Recent Labs  Lab 03/27/20  0432  WBC 8.8  HGB 14.4  HCT 39.2  PLT 165    Chemistries  Recent Labs  Lab 03/25/20 1419 03/26/20 1521 03/27/20 0432  NA 119*   < > 133*  K 3.5   < > 4.2  CL 83*   < > 102  CO2 24   < > 27  GLUCOSE 157*   < > 139*  BUN 12   < > 11  CREATININE 0.68   < > 0.72  CALCIUM 8.6*   < > 8.3*  MG 1.7  --   --   AST 35  --  24  ALT 48*  --  34  ALKPHOS 80  --  61  BILITOT 1.0  --  0.6   < > = values in this interval not displayed.     Microbiology Results  Results for orders placed or performed during the hospital encounter of 03/25/20  SARS Coronavirus 2 by RT PCR (hospital order, performed in [facility] hospital lab) Nasopharyngeal Nasopharyngeal Swab     Status: Abnormal   Collection Time: 03/26/20 11:35 AM   Specimen: Nasopharyngeal Swab  Result Value Ref Range Status   SARS Coronavirus 2 POSITIVE (A) NEGATIVE Final    Comment: RESULT CALLED TO, READ BACK BY AND VERIFIED WITH: AMANDA PREZ @1725  03/26/20 MJU (NOTE) SARS-CoV-2 target  nucleic acids are DETECTED  SARS-CoV-2 RNA is generally detectable in upper respiratory specimens  during the acute phase of infection.  Positive results are indicative  of the presence of the identified virus, but do not rule out bacterial infection or co-infection with other pathogens not detected by the test.  Clinical correlation with patient history and  other diagnostic information is necessary to determine patient infection status.  The expected result is negative.  Fact Sheet for Patients:   05/26/20   Fact Sheet for Healthcare Providers:   BoilerBrush.com.cy    This test is not yet approved or cleared by the https://pope.com/ FDA and  has been authorized for detection and/or diagnosis of SARS-CoV-2 by FDA under an Emergency Use Authorization (EUA).  This EUA will remain in effect (meaning this test can  be used) for the duration of  the COVID-19 declaration under Section 564(b)(1) of the Act, 21 U.S.C. section 360-bbb-3(b)(1), unless the authorization is terminated or revoked sooner.  Performed at [facility], [address] [place], [place]       Management plans discussed with the patient, family and they are in agreement.  CODE STATUS:     Code Status Orders  (From admission, onward)         Start     Ordered   03/26/20 0215  Full code  Continuous        03/26/20 0216        Code Status History    This patient has a current code status but no historical code status.   Advance Care Planning Activity      TOTAL TIME TAKING CARE OF THIS PATIENT: 35 minutes.    05/26/20 M.D on 03/27/2020 at 4:17 PM  Between 7am to 6pm - Pager - [phone]  After 6pm go to [email or web address] - password [email or web address]  [facility]  CC: Primary care physician; [phone], NP

## 2020-03-27 NOTE — Discharge Instructions (Signed)
Patient scheduled for outpatient Remdesivir infusions at 11am on Friday 9/3, Saturday 9/4, and Sunday 9/5 at [facility]. Please inform the patient to park at [address] [place], [place], as staff will be escorting the patient through the east entrance of the hospital. Appointments will take approximately 45 minutes.    There is a wave flag banner located near the entrance on [address]. Turn into this entrance and immediately turn left and park in 1 of the 5 designated Covid Infusion Parking spots. There is a phone number on the sign, please call and let the staff know what spot you are in and we will come out and get you. For questions call [phone].  Thanks.

## 2020-03-28 ENCOUNTER — Ambulatory Visit (HOSPITAL_COMMUNITY): Payer: BC Managed Care – PPO

## 2020-03-28 ENCOUNTER — Encounter: Payer: Self-pay | Admitting: Emergency Medicine

## 2020-03-28 ENCOUNTER — Emergency Department: Payer: BC Managed Care – PPO

## 2020-03-28 ENCOUNTER — Other Ambulatory Visit: Payer: Self-pay

## 2020-03-28 ENCOUNTER — Emergency Department
Admission: EM | Admit: 2020-03-28 | Discharge: 2020-03-28 | Disposition: A | Discharge status: Home / Self Care | Payer: BC Managed Care – PPO | Attending: Emergency Medicine | Admitting: Emergency Medicine

## 2020-03-28 DIAGNOSIS — Z7951 Long term (current) use of inhaled steroids: Secondary | ICD-10-CM | POA: Diagnosis not present

## 2020-03-28 DIAGNOSIS — U071 COVID-19: Secondary | ICD-10-CM | POA: Insufficient documentation

## 2020-03-28 DIAGNOSIS — Z79899 Other long term (current) drug therapy: Secondary | ICD-10-CM | POA: Diagnosis not present

## 2020-03-28 DIAGNOSIS — Z7982 Long term (current) use of aspirin: Secondary | ICD-10-CM | POA: Diagnosis not present

## 2020-03-28 DIAGNOSIS — R42 Dizziness and giddiness: Secondary | ICD-10-CM | POA: Diagnosis not present

## 2020-03-28 DIAGNOSIS — I951 Orthostatic hypotension: Secondary | ICD-10-CM | POA: Insufficient documentation

## 2020-03-28 DIAGNOSIS — R042 Hemoptysis: Secondary | ICD-10-CM | POA: Diagnosis present

## 2020-03-28 DIAGNOSIS — R55 Syncope and collapse: Secondary | ICD-10-CM

## 2020-03-28 DIAGNOSIS — R059 Cough, unspecified: Secondary | ICD-10-CM

## 2020-03-28 LAB — ABO/RH: ABO/RH(D): O NEG

## 2020-03-28 LAB — COMPREHENSIVE METABOLIC PANEL
ALT: 43 U/L (ref 0–44)
AST: 31 U/L (ref 15–41)
Albumin: 4 g/dL (ref 3.5–5.0)
Alkaline Phosphatase: 73 U/L (ref 38–126)
Anion gap: 8 (ref 5–15)
BUN: 17 mg/dL (ref 6–20)
CO2: 28 mmol/L (ref 22–32)
Calcium: 8.8 mg/dL — ABNORMAL LOW (ref 8.9–10.3)
Chloride: 96 mmol/L — ABNORMAL LOW (ref 98–111)
Creatinine, Ser: 1.13 mg/dL (ref 0.61–1.24)
GFR calc Af Amer: >60 mL/min (ref >60)
GFR calc non Af Amer: >60 mL/min (ref >60)
Glucose, Bld: 148 mg/dL — ABNORMAL HIGH (ref 70–99)
Potassium: 3.8 mmol/L (ref 3.5–5.1)
Sodium: 132 mmol/L — ABNORMAL LOW (ref 135–145)
Total Bilirubin: 0.7 mg/dL (ref 0.3–1.2)
Total Protein: 7.2 g/dL (ref 6.5–8.1)

## 2020-03-28 LAB — CBC WITH DIFFERENTIAL/PLATELET
Abs Immature Granulocytes: 0.07 10*3/uL (ref 0.00–0.07)
Basophils Absolute: 0 10*3/uL (ref 0.0–0.1)
Basophils Relative: 0 %
Eosinophils Absolute: 0 10*3/uL (ref 0.0–0.5)
Eosinophils Relative: 0 %
HCT: 42.9 % (ref 39.0–52.0)
Hemoglobin: 15.7 g/dL (ref 13.0–17.0)
Immature Granulocytes: 1 %
Lymphocytes Relative: 18 %
Lymphs Abs: 2.4 10*3/uL (ref 0.7–4.0)
MCH: 31 pg (ref 26.0–34.0)
MCHC: 36.6 g/dL — ABNORMAL HIGH (ref 30.0–36.0)
MCV: 84.8 fL (ref 80.0–100.0)
Monocytes Absolute: 1.4 10*3/uL — ABNORMAL HIGH (ref 0.1–1.0)
Monocytes Relative: 11 %
Neutro Abs: 9.2 10*3/uL — ABNORMAL HIGH (ref 1.7–7.7)
Neutrophils Relative %: 70 %
Platelets: 212 10*3/uL (ref 150–400)
RBC: 5.06 MIL/uL (ref 4.22–5.81)
RDW: 11.6 % (ref 11.5–15.5)
WBC: 13.1 10*3/uL — ABNORMAL HIGH (ref 4.0–10.5)
nRBC: 0 % (ref 0.0–0.2)

## 2020-03-28 LAB — MAGNESIUM: Magnesium: 2.2 mg/dL (ref 1.7–2.4)

## 2020-03-28 LAB — PROCALCITONIN: Procalcitonin: <0.1 ng/mL

## 2020-03-28 LAB — C-REACTIVE PROTEIN: CRP: 0.6 mg/dL (ref <1.0)

## 2020-03-28 LAB — TROPONIN I (HIGH SENSITIVITY)
Troponin I (High Sensitivity): 14 ng/L (ref <18)
Troponin I (High Sensitivity): 5 ng/L (ref <18)

## 2020-03-28 LAB — FIBRIN DERIVATIVES D-DIMER (ARMC ONLY): Fibrin derivatives D-dimer (ARMC): 350.24 ng/mL (FEU) (ref 0.00–499.00)

## 2020-03-28 LAB — FIBRINOGEN: Fibrinogen: 500 mg/dL — ABNORMAL HIGH (ref 210–475)

## 2020-03-28 LAB — FERRITIN: Ferritin: 436 ng/mL — ABNORMAL HIGH (ref 24–336)

## 2020-03-28 LAB — BRAIN NATRIURETIC PEPTIDE: B Natriuretic Peptide: 27.9 pg/mL (ref 0.0–100.0)

## 2020-03-28 LAB — LACTATE DEHYDROGENASE: LDH: 170 U/L (ref 98–192)

## 2020-03-28 LAB — HEPATITIS B SURFACE ANTIGEN: Hepatitis B Surface Ag: NONREACTIVE

## 2020-03-28 IMAGING — MR MR MRA HEAD W/O CM
1 series · 18 of 48 positions shown · non-contrast
Comparison: Noncontrast head CT performed earlier the same day
[DATE]

CLINICAL DATA: Dizziness, persistent/recurrent, cardiac or vascular
cause suspected. Additional history provided: Recent [6U]
infection, dizziness and near syncope.

EXAM:
MRI HEAD WITHOUT CONTRAST
MRA HEAD WITHOUT CONTRAST
TECHNIQUE: Multiplanar, multiecho pulse sequences of the brain and surrounding
structures were obtained without intravenous contrast. Angiographic
images of the head were obtained using MRA technique without
contrast.

[Series 5: TOF · axial · 0.5mm · 0.41mm/px · z∈[-67,+29]mm · 18 of 205 slices shown]
[im 1/205]
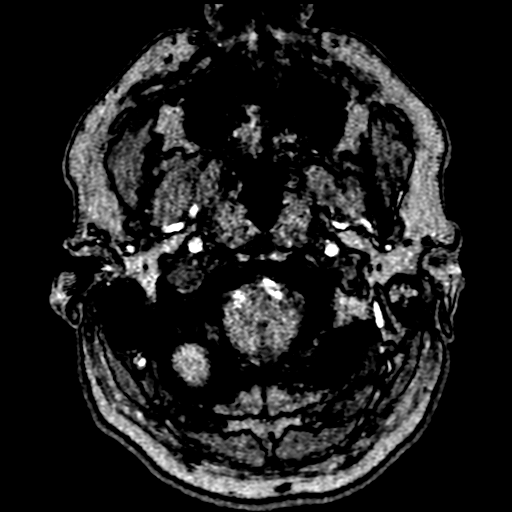
[im 5/205]
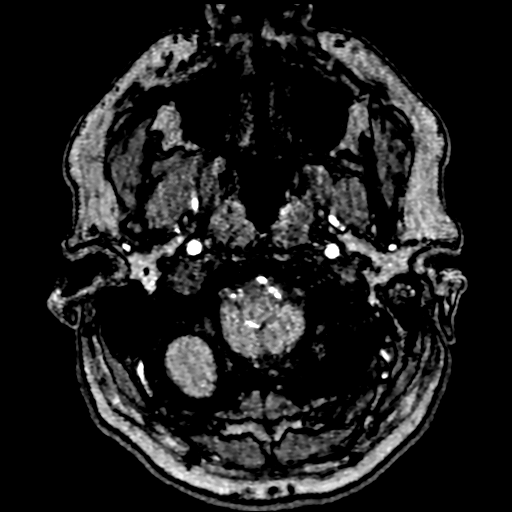
[im 9/205]
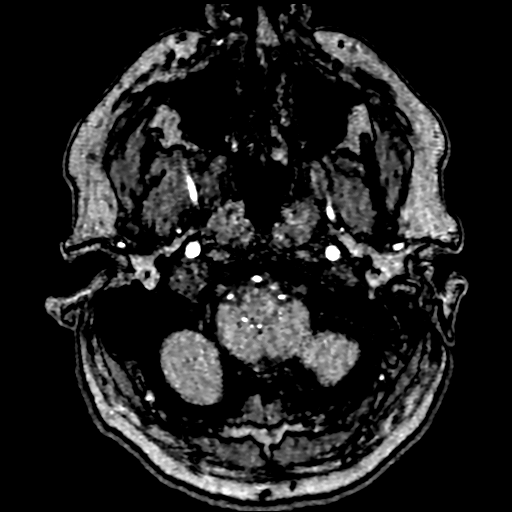
[im 14/205]
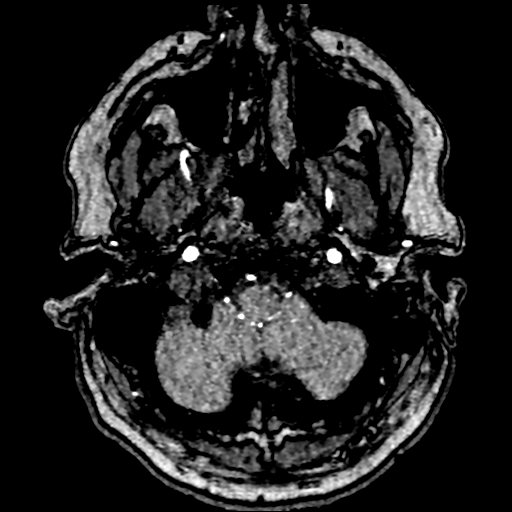
[im 18/205]
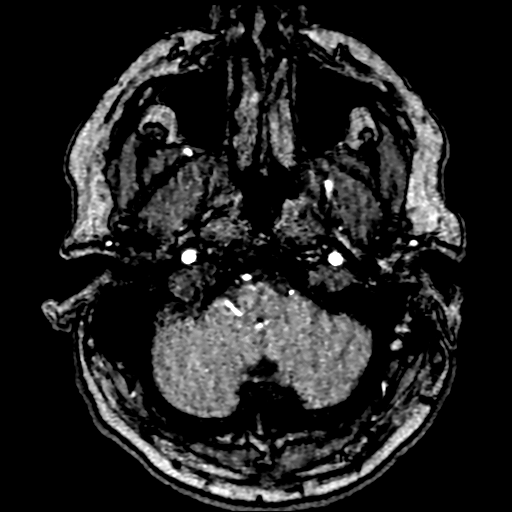
[im 22/205]
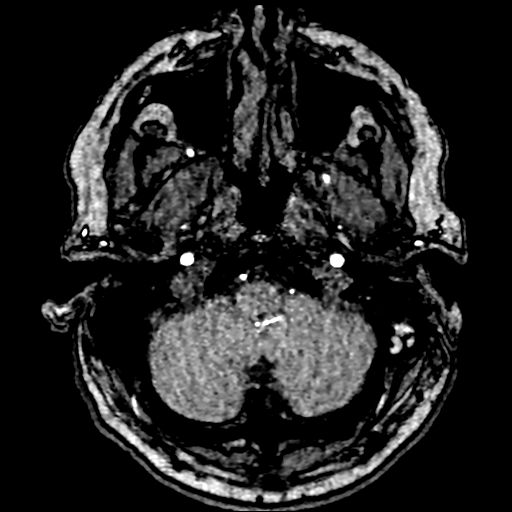
[im 27/205]
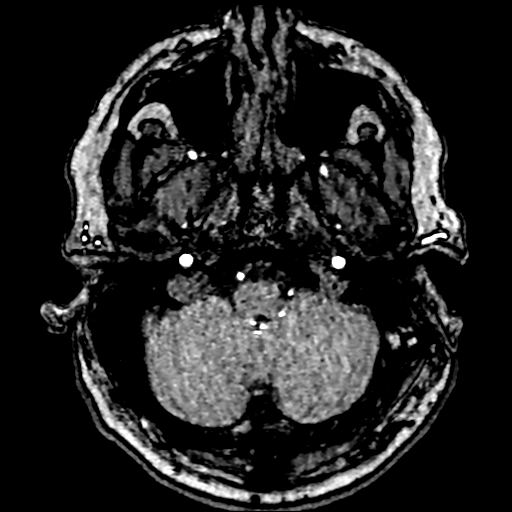
[im 31/205]
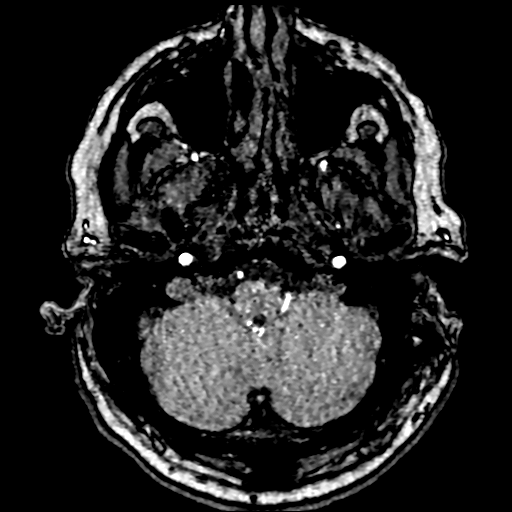
[im 35/205]
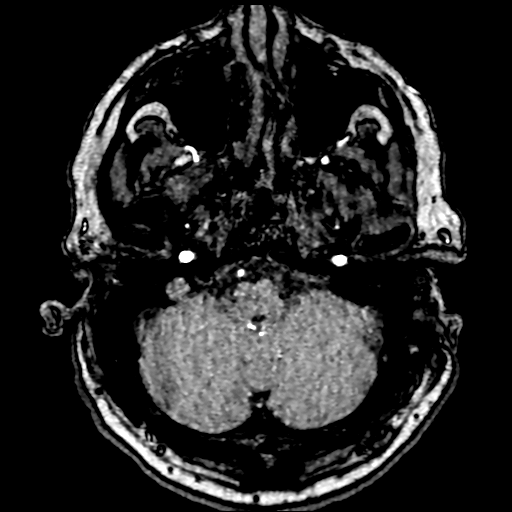
[im 40/205]
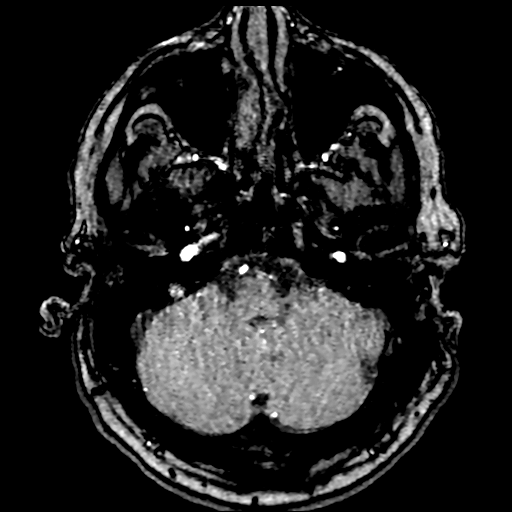
[im 66/205]
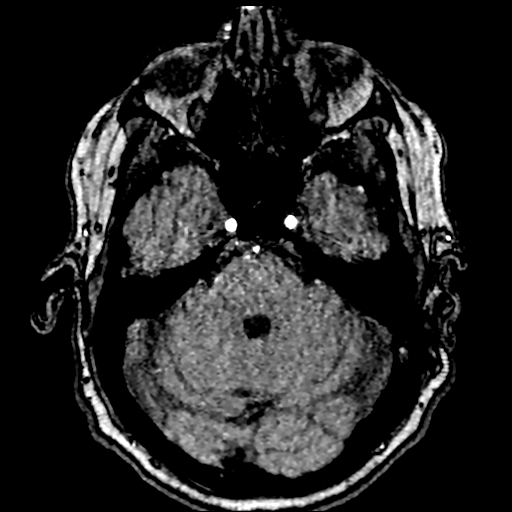
[im 92/205]
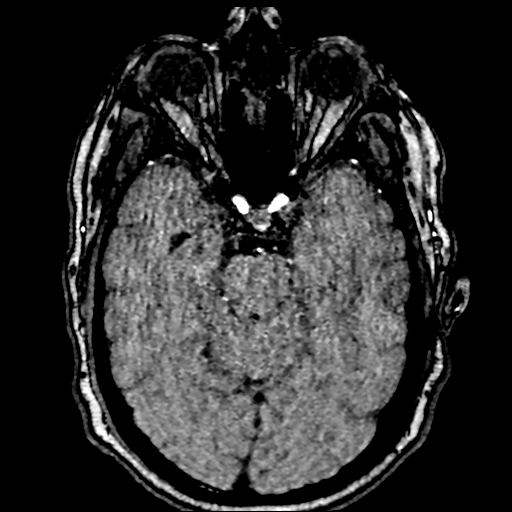
[im 105/205]
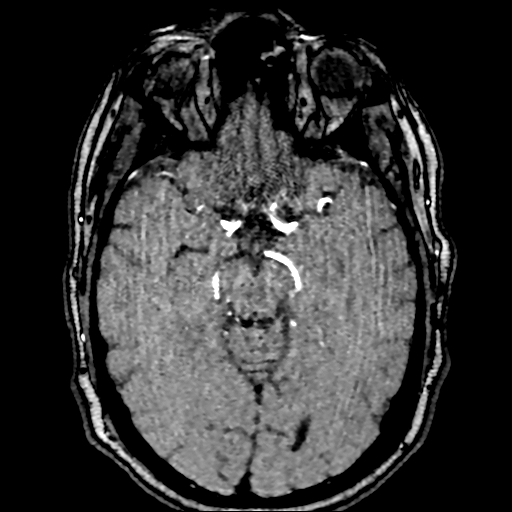
[im 118/205]
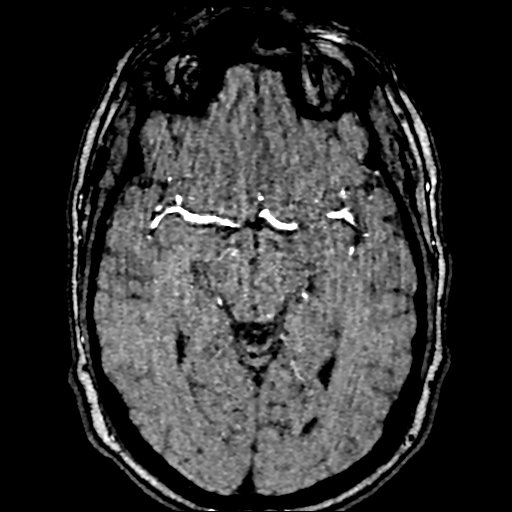
[im 144/205]
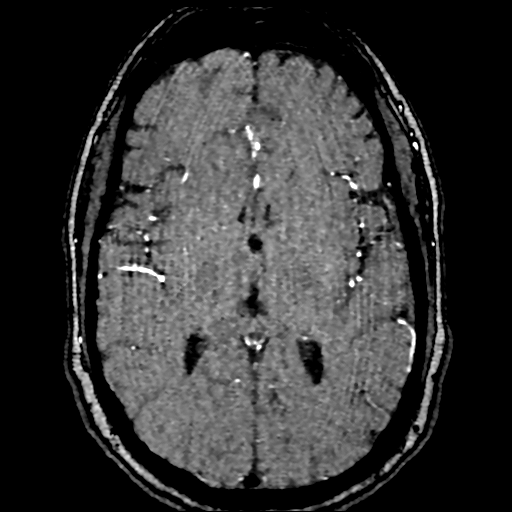
[im 170/205]
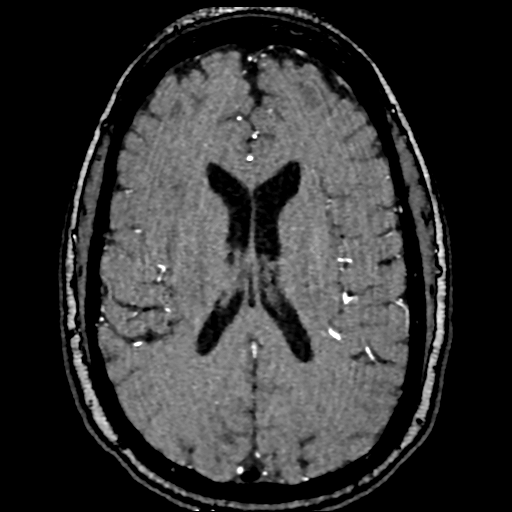
[im 174/205]
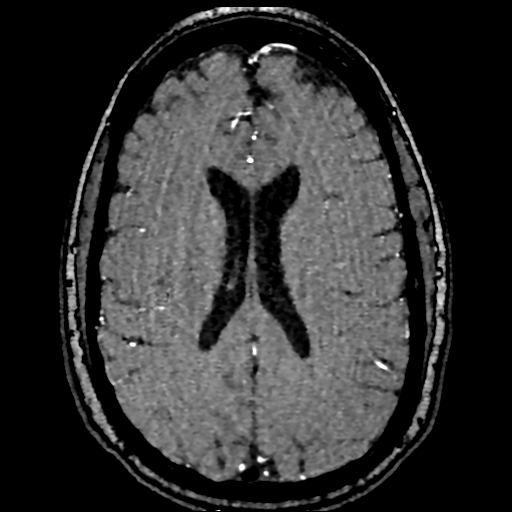
[im 196/205]
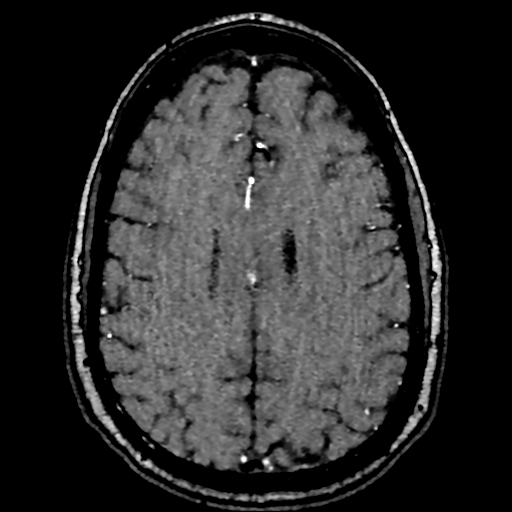

[18 of 48 positions shown; findings below may reference images not displayed]

FINDINGS: MRI HEAD FINDINGS

Brain:

Cerebral volume is normal.

No significant white matter disease for age.

There is no acute infarct.

No evidence of intracranial mass.

No chronic intracranial blood products.

No extra-axial fluid collection.

No midline shift.

Vascular: Reported below.

Skull and upper cervical spine: No focal marrow lesion.

Sinuses/Orbits: Visualized orbits show no acute finding. Mild
ethmoid and left frontal sinus mucosal thickening. Left mastoid
effusion.

MRA HEAD FINDINGS

The intracranial internal carotid arteries are patent. The M1 middle
cerebral arteries are patent without significant stenosis. No M2
proximal branch occlusion or high-grade proximal stenosis is
identified. The anterior cerebral arteries are patent. No
intracranial aneurysm is identified.

The visualized non dominant intracranial right vertebral artery is
patent and appears to terminate as the right PICA. The visualized
intracranial left vertebral artery is patent without significant
stenosis. Developmentally diminutive basilar artery in the presence
of a fetal origin right posterior cerebral artery and predominantly
fetal origin left posterior cerebral artery. The basilar artery is
patent. The posterior cerebral arteries are patent bilaterally.
There is a high-grade focal stenosis within a developmentally
hypoplastic left P1 segment. A large left posterior communicating
artery is patent without significant stenosis. No significant
proximal right PCA stenosis.
IMPRESSION: MRI brain:

1. No evidence of acute intracranial abnormality, including acute
infarction.
2. Unremarkable MRI appearance of the brain for age.
3. Mild ethmoid and left frontal sinus mucosal thickening.
4. Left mastoid effusion.

MRA head:

1. No intracranial large vessel occlusion.
2. Developmentally diminutive basilar artery in the presence of a
fetal origin right posterior cerebral artery and predominantly fetal
origin left posterior cerebral artery.
3. High-grade focal stenosis within a developmentally hypoplastic
left P1 segment. However, there is a sizable left posterior
communicating artery which is patent without stenosis.
4. No other high-grade proximal arterial stenosis is identified.

## 2020-03-28 IMAGING — MR MR HEAD W/O CM
8 of 10 series · 34 of 48 positions shown · non-contrast
Comparison: Noncontrast head CT performed earlier the same day
[DATE]

CLINICAL DATA: Dizziness, persistent/recurrent, cardiac or vascular
cause suspected. Additional history provided: Recent [6U]
infection, dizziness and near syncope.

EXAM:
MRI HEAD WITHOUT CONTRAST
MRA HEAD WITHOUT CONTRAST
TECHNIQUE: Multiplanar, multiecho pulse sequences of the brain and surrounding
structures were obtained without intravenous contrast. Angiographic
images of the head were obtained using MRA technique without
contrast.

[Series 5: ax dwi_tracew · axial · 3.0mm · 0.60mm/px · z∈[-73,+86]mm · 6 of 50 slices shown]
[im 1/50]
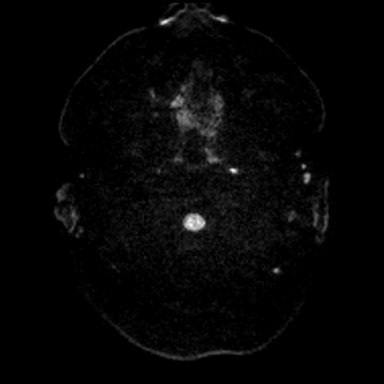
[im 10/50]
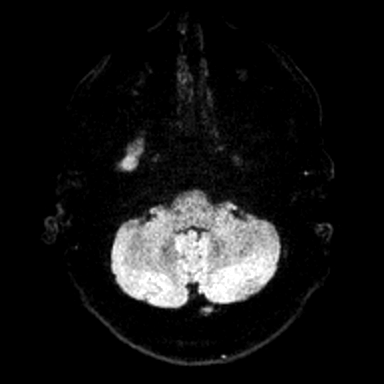
[im 20/50]
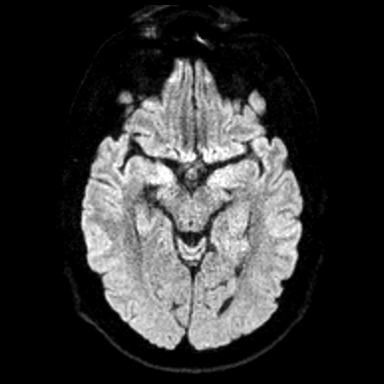
[im 30/50]
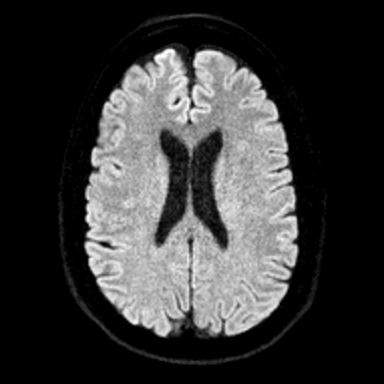
[im 40/50]
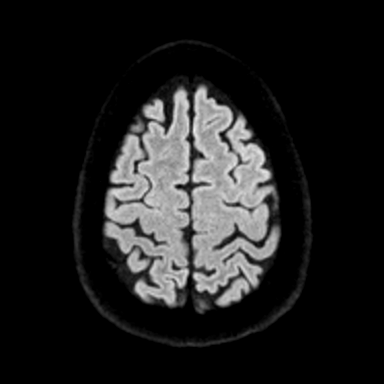
[im 50/50]
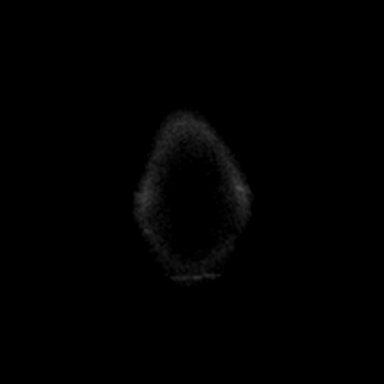

[Series 6: ax dwi_adc · axial · 3.0mm · 0.60mm/px · z∈[-73,+86]mm · 6 of 50 slices shown]
[im 1/50]
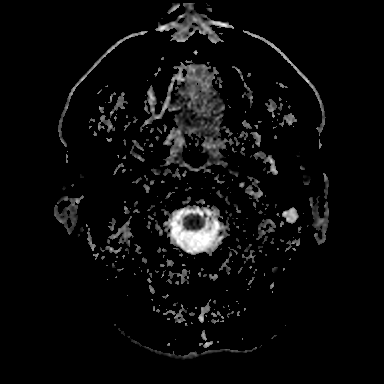
[im 10/50]
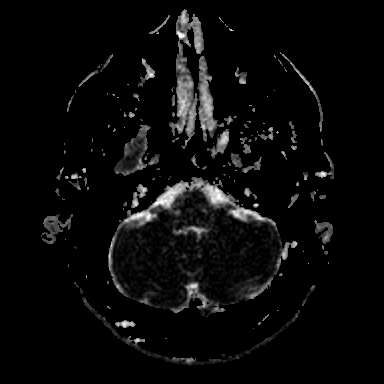
[im 20/50]
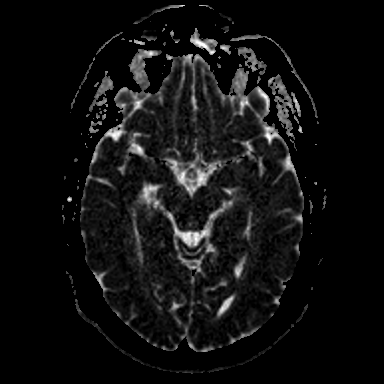
[im 30/50]
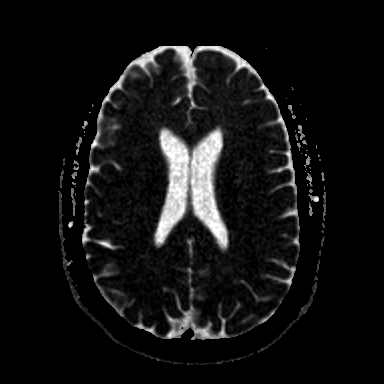
[im 40/50]
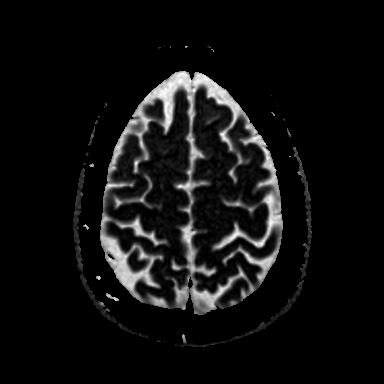
[im 50/50]
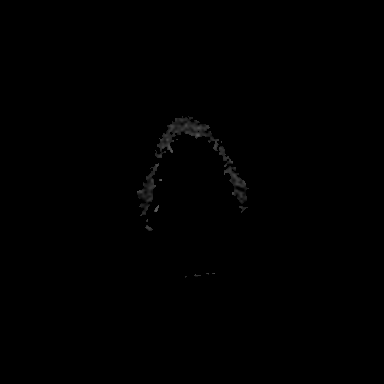

[Series 7: cor dwi_tracew · coronal · 5.0mm · 0.60mm/px · 2 of 42 slices shown]
[im 1/42]
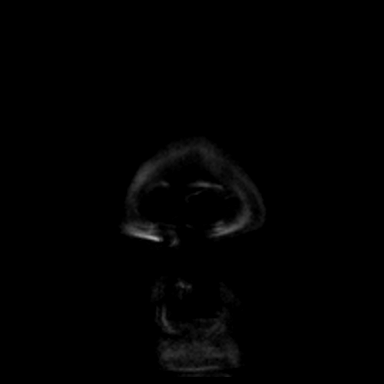
[im 9/42]
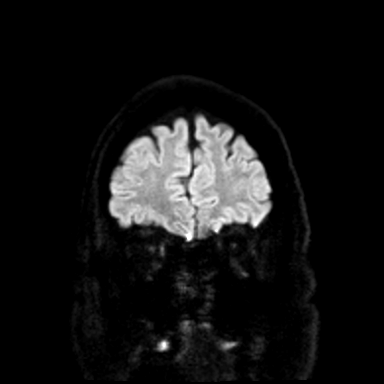

[Series 9: T1 · sagittal · 5.0mm · 0.94mm/px · 3 of 23 slices shown (1 of 2)]
[im 1/23]
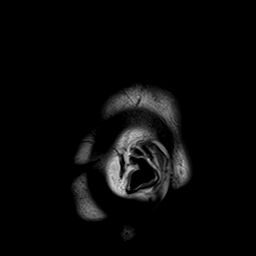
[im 12/23]
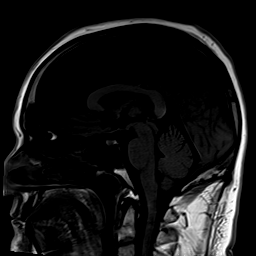
[im 23/23]
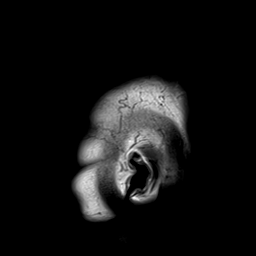

[Series 10: T2 · axial · 5.0mm · 0.45mm/px · z∈[-74,+86]mm · 4 of 28 slices shown (1 of 2)]
[im 1/28]
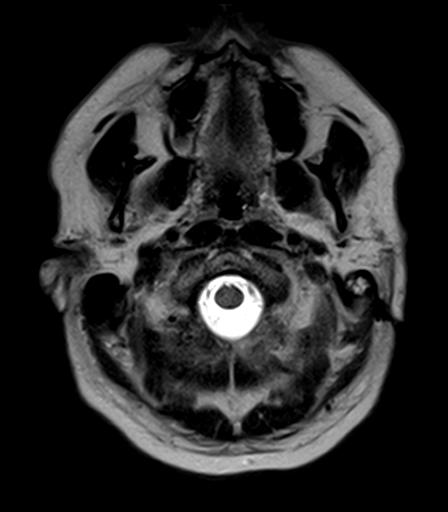
[im 10/28]
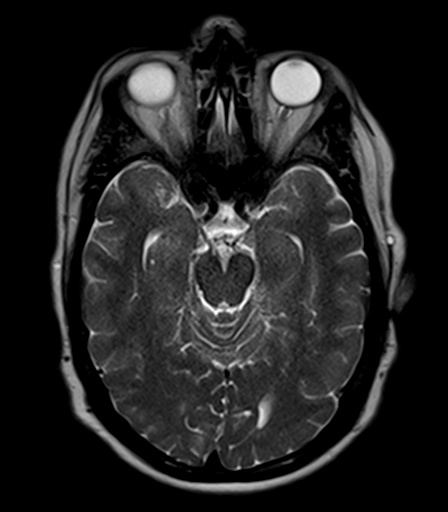
[im 19/28]
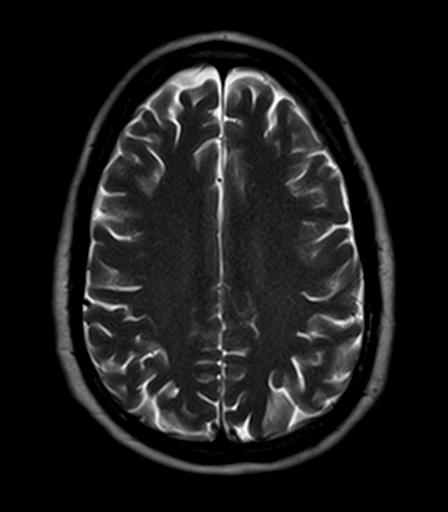
[im 28/28]
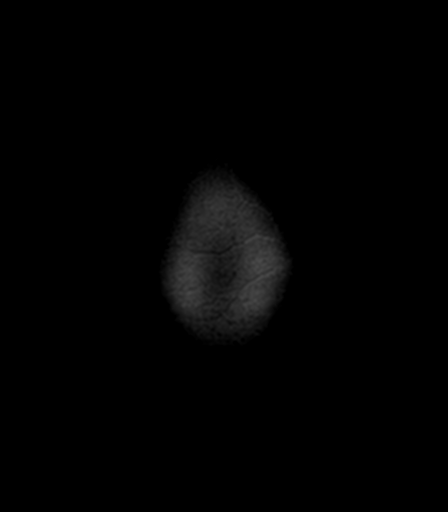

[Series 12: FLAIR · axial · 5.0mm · 1.20mm/px · z∈[-73,+86]mm · 4 of 28 slices shown]
[im 1/28]
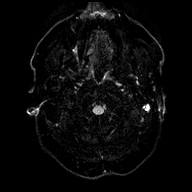
[im 10/28]
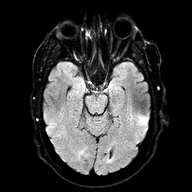
[im 19/28]
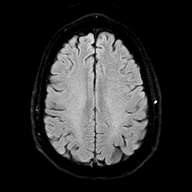
[im 28/28]
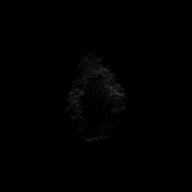

[Series 13: T1 · axial · 5.0mm · 0.90mm/px · z∈[-74,+86]mm · 4 of 28 slices shown (2 of 2)]
[im 1/28]
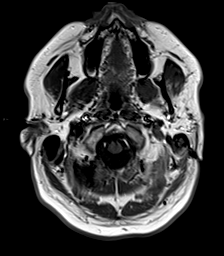
[im 10/28]
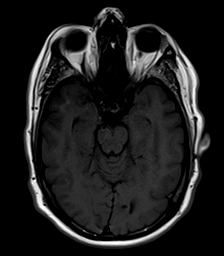
[im 19/28]
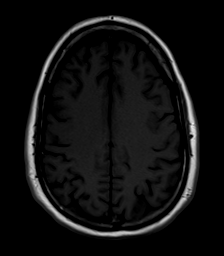
[im 28/28]
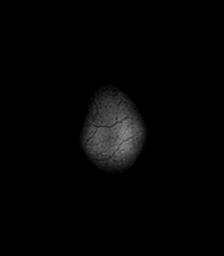

[Series 14: T2 · coronal · 5.0mm · 0.45mm/px · 5 of 34 slices shown (2 of 2)]
[im 1/34]
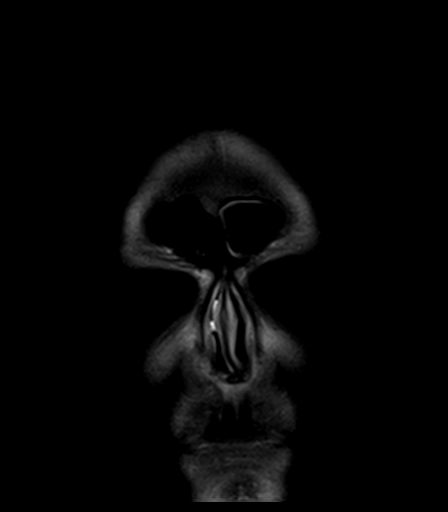
[im 9/34]
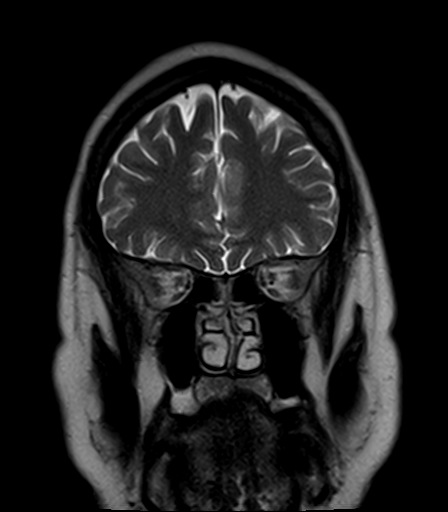
[im 17/34]
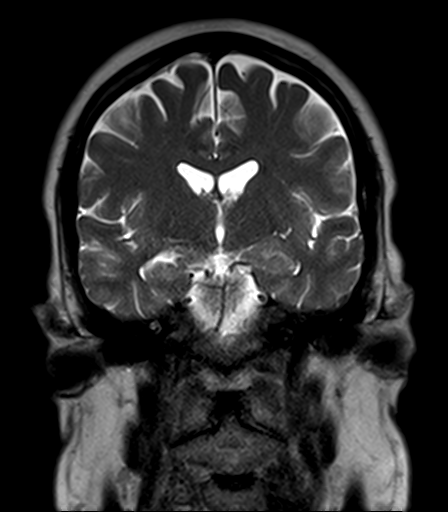
[im 25/34]
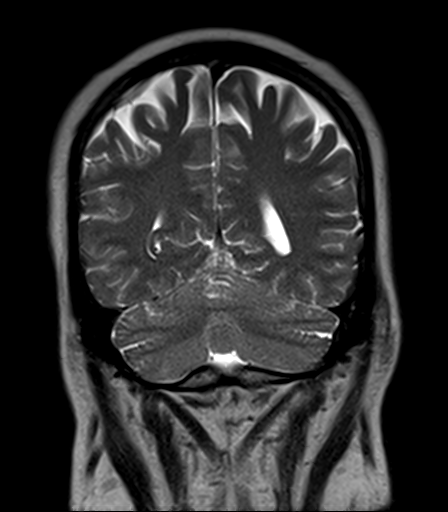
[im 34/34]
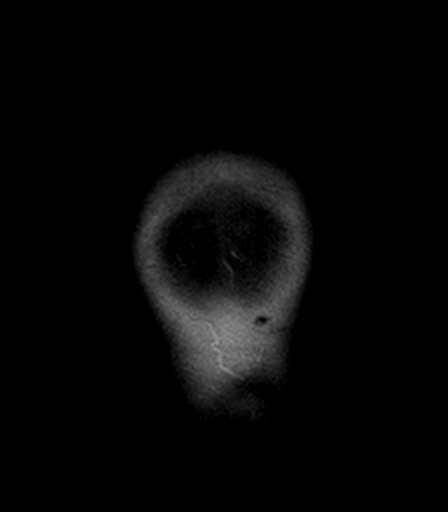

[34 of 48 positions shown; findings below may reference images not displayed]

FINDINGS: MRI HEAD FINDINGS

Brain:

Cerebral volume is normal.

No significant white matter disease for age.

There is no acute infarct.

No evidence of intracranial mass.

No chronic intracranial blood products.

No extra-axial fluid collection.

No midline shift.

Vascular: Reported below.

Skull and upper cervical spine: No focal marrow lesion.

Sinuses/Orbits: Visualized orbits show no acute finding. Mild
ethmoid and left frontal sinus mucosal thickening. Left mastoid
effusion.

MRA HEAD FINDINGS

The intracranial internal carotid arteries are patent. The M1 middle
cerebral arteries are patent without significant stenosis. No M2
proximal branch occlusion or high-grade proximal stenosis is
identified. The anterior cerebral arteries are patent. No
intracranial aneurysm is identified.

The visualized non dominant intracranial right vertebral artery is
patent and appears to terminate as the right PICA. The visualized
intracranial left vertebral artery is patent without significant
stenosis. Developmentally diminutive basilar artery in the presence
of a fetal origin right posterior cerebral artery and predominantly
fetal origin left posterior cerebral artery. The basilar artery is
patent. The posterior cerebral arteries are patent bilaterally.
There is a high-grade focal stenosis within a developmentally
hypoplastic left P1 segment. A large left posterior communicating
artery is patent without significant stenosis. No significant
proximal right PCA stenosis.
IMPRESSION: MRI brain:

1. No evidence of acute intracranial abnormality, including acute
infarction.
2. Unremarkable MRI appearance of the brain for age.
3. Mild ethmoid and left frontal sinus mucosal thickening.
4. Left mastoid effusion.

MRA head:

1. No intracranial large vessel occlusion.
2. Developmentally diminutive basilar artery in the presence of a
fetal origin right posterior cerebral artery and predominantly fetal
origin left posterior cerebral artery.
3. High-grade focal stenosis within a developmentally hypoplastic
left P1 segment. However, there is a sizable left posterior
communicating artery which is patent without stenosis.
4. No other high-grade proximal arterial stenosis is identified.

## 2020-03-28 IMAGING — CT CT HEAD W/O CM
3 series · 16 of 47 positions shown, 19 images · non-contrast
Comparison: [DATE] maxillofacial CT

CLINICAL DATA: 59-year-old male with dizziness and altered mental
status.

EXAM:
CT HEAD WITHOUT CONTRAST
TECHNIQUE: Contiguous axial images were obtained from the base of the skull
through the vertex without intravenous contrast.

[Series 2: head wo · axial · 0.47mm/px · z∈[-84,+46]mm · 10 of 32 slices shown, 13 images]
[im 3/32  brain]
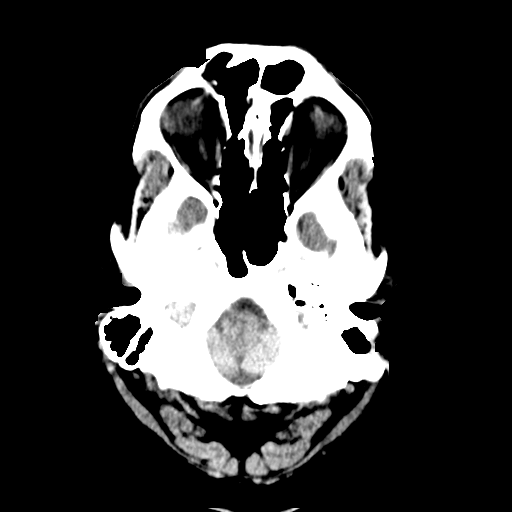
[im 3/32  bone]
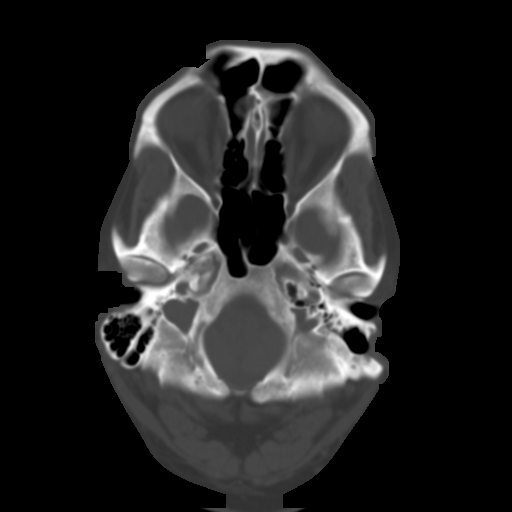
[im 6/32  brain]
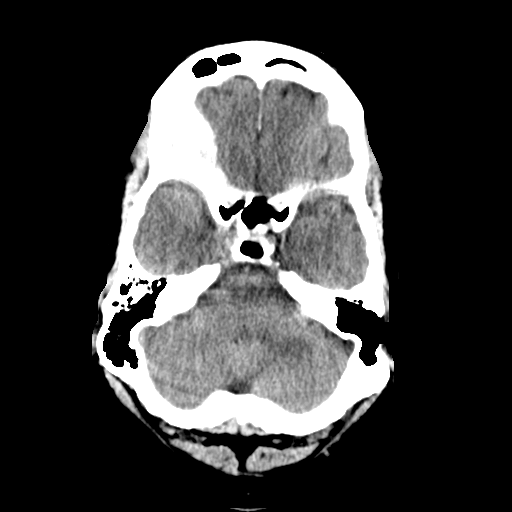
[im 9/32  brain]
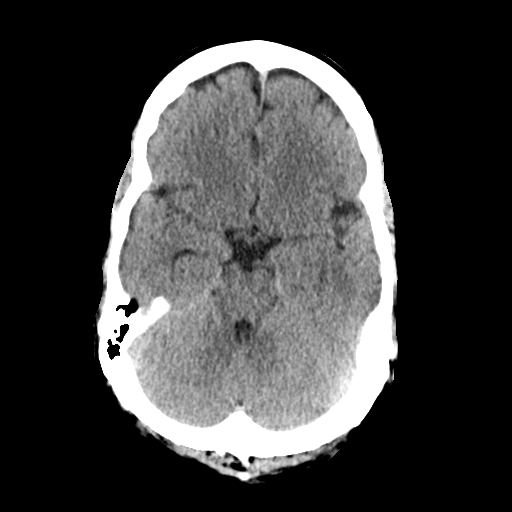
[im 11/32  brain]
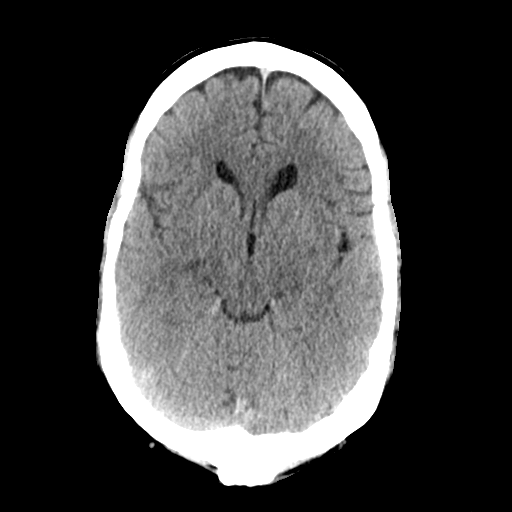
[im 14/32  brain]
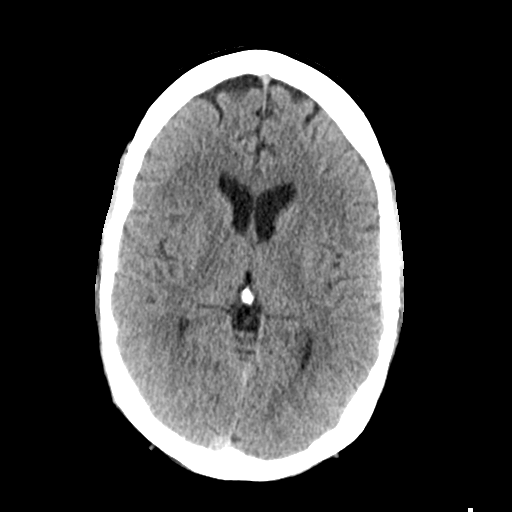
[im 14/32  bone]
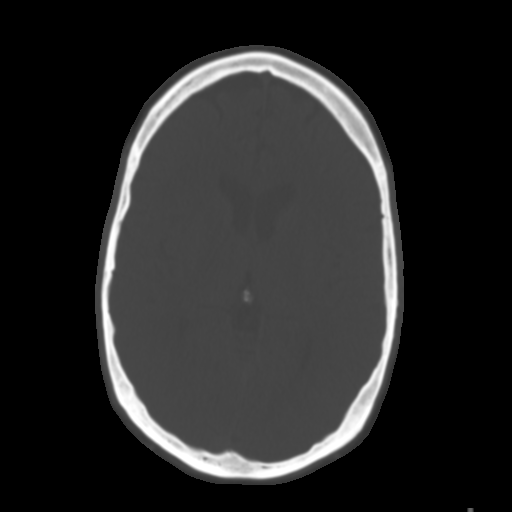
[im 18/32  brain]
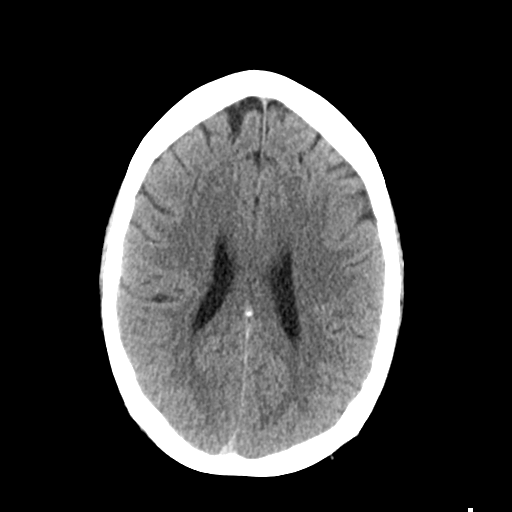
[im 21/32  brain]
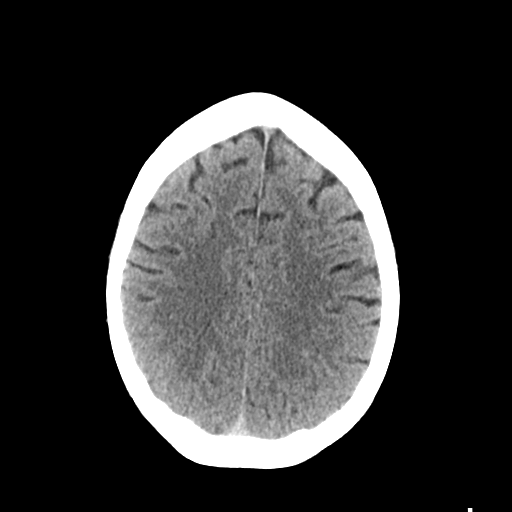
[im 24/32  brain]
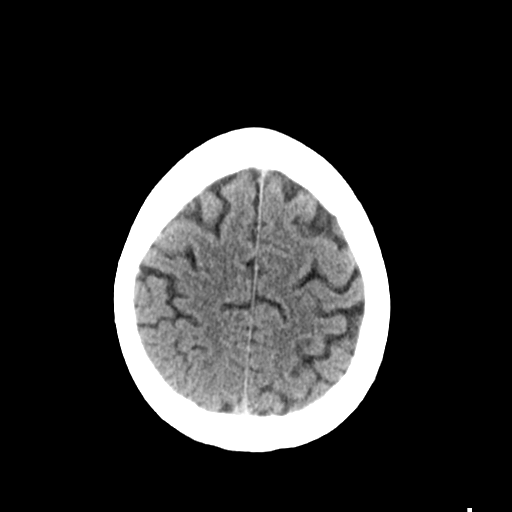
[im 26/32  brain]
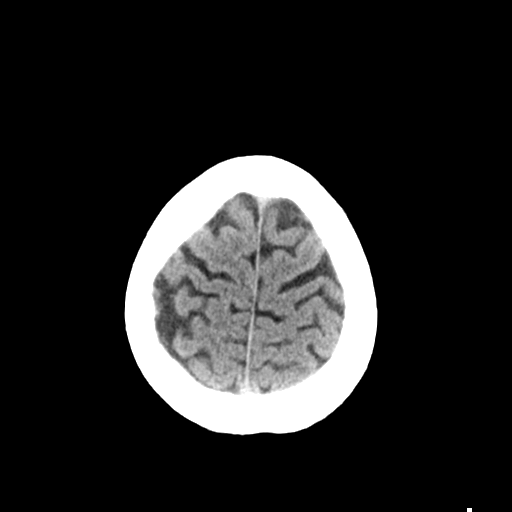
[im 26/32  bone]
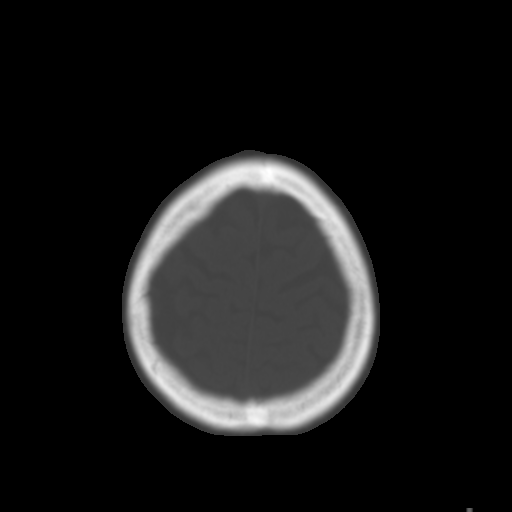
[im 29/32  brain]
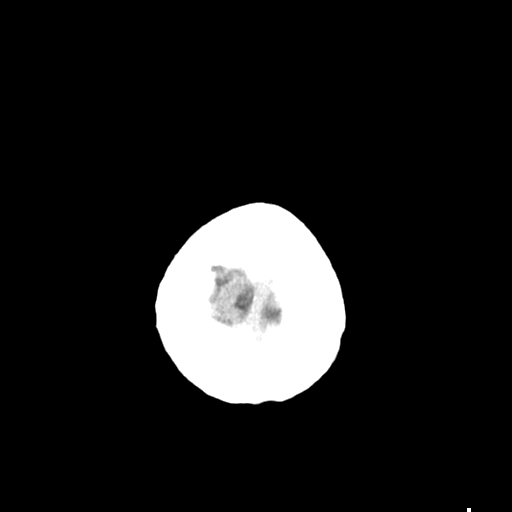

[Series 4: coronal soft tissue · coronal · 0.32mm/px · 3 of 69 slices shown]
[im 23/69  brain]
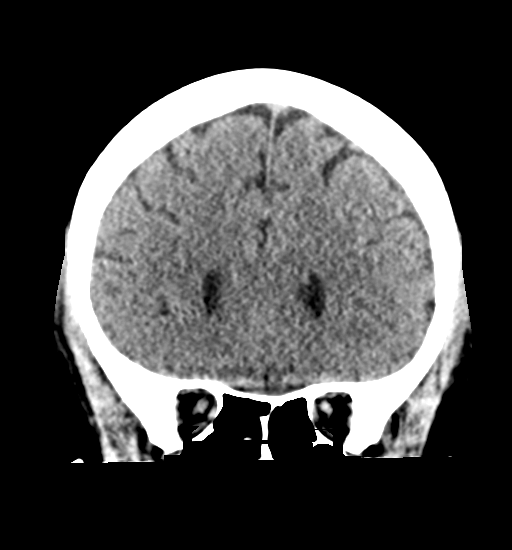
[im 31/69  brain]
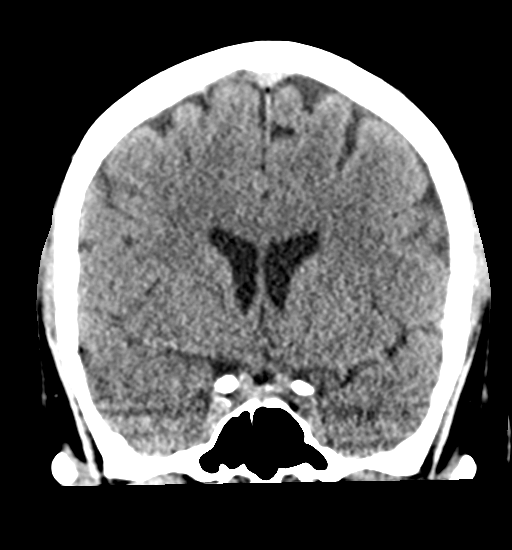
[im 38/69  brain]
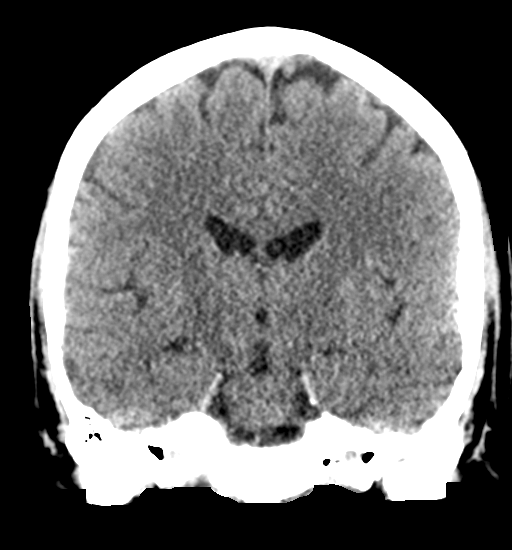

[Series 5: sagittal soft tissue · sagittal · 0.34mm/px · 3 of 48 slices shown]
[im 16/48  brain]
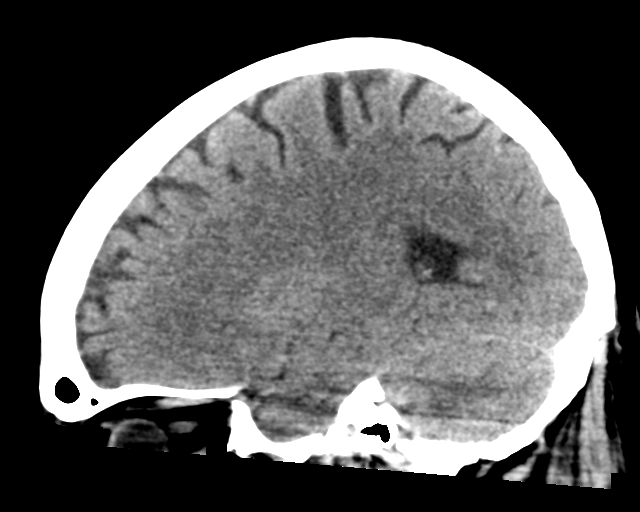
[im 24/48  brain]
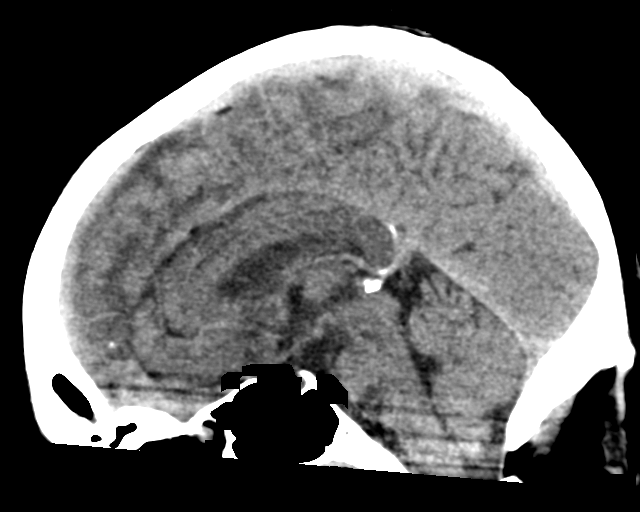
[im 32/48  brain]
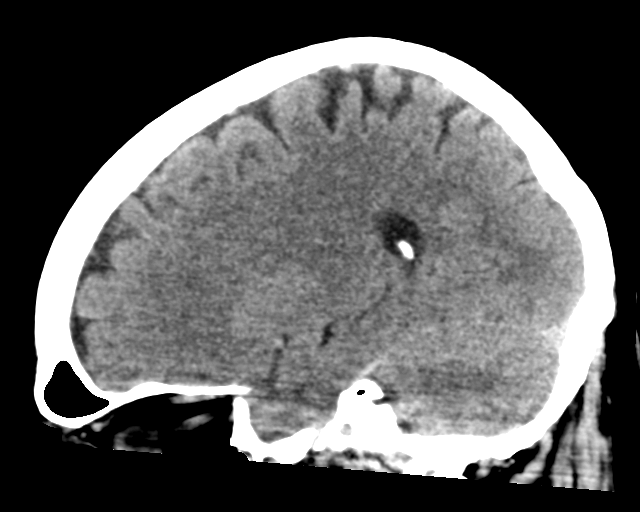

[16 of 47 positions shown; findings below may reference images not displayed]

FINDINGS: Brain: No evidence of acute infarction, hemorrhage, hydrocephalus,
extra-axial collection or mass lesion/mass effect.

Vascular: No hyperdense vessel or unexpected calcification.

Skull: Normal. Negative for fracture or focal lesion.

Sinuses/Orbits: No acute finding.

Other: None.
IMPRESSION: Unremarkable noncontrast head CT.

## 2020-03-28 IMAGING — DX DG CHEST 1V PORT
1 series · 1 of 1 positions shown · non-contrast
Comparison: [DATE]

CLINICAL DATA: Dizziness with productive cough and chest pain.
COVID positive.

EXAM:
PORTABLE CHEST 1 VIEW

[chest ap]
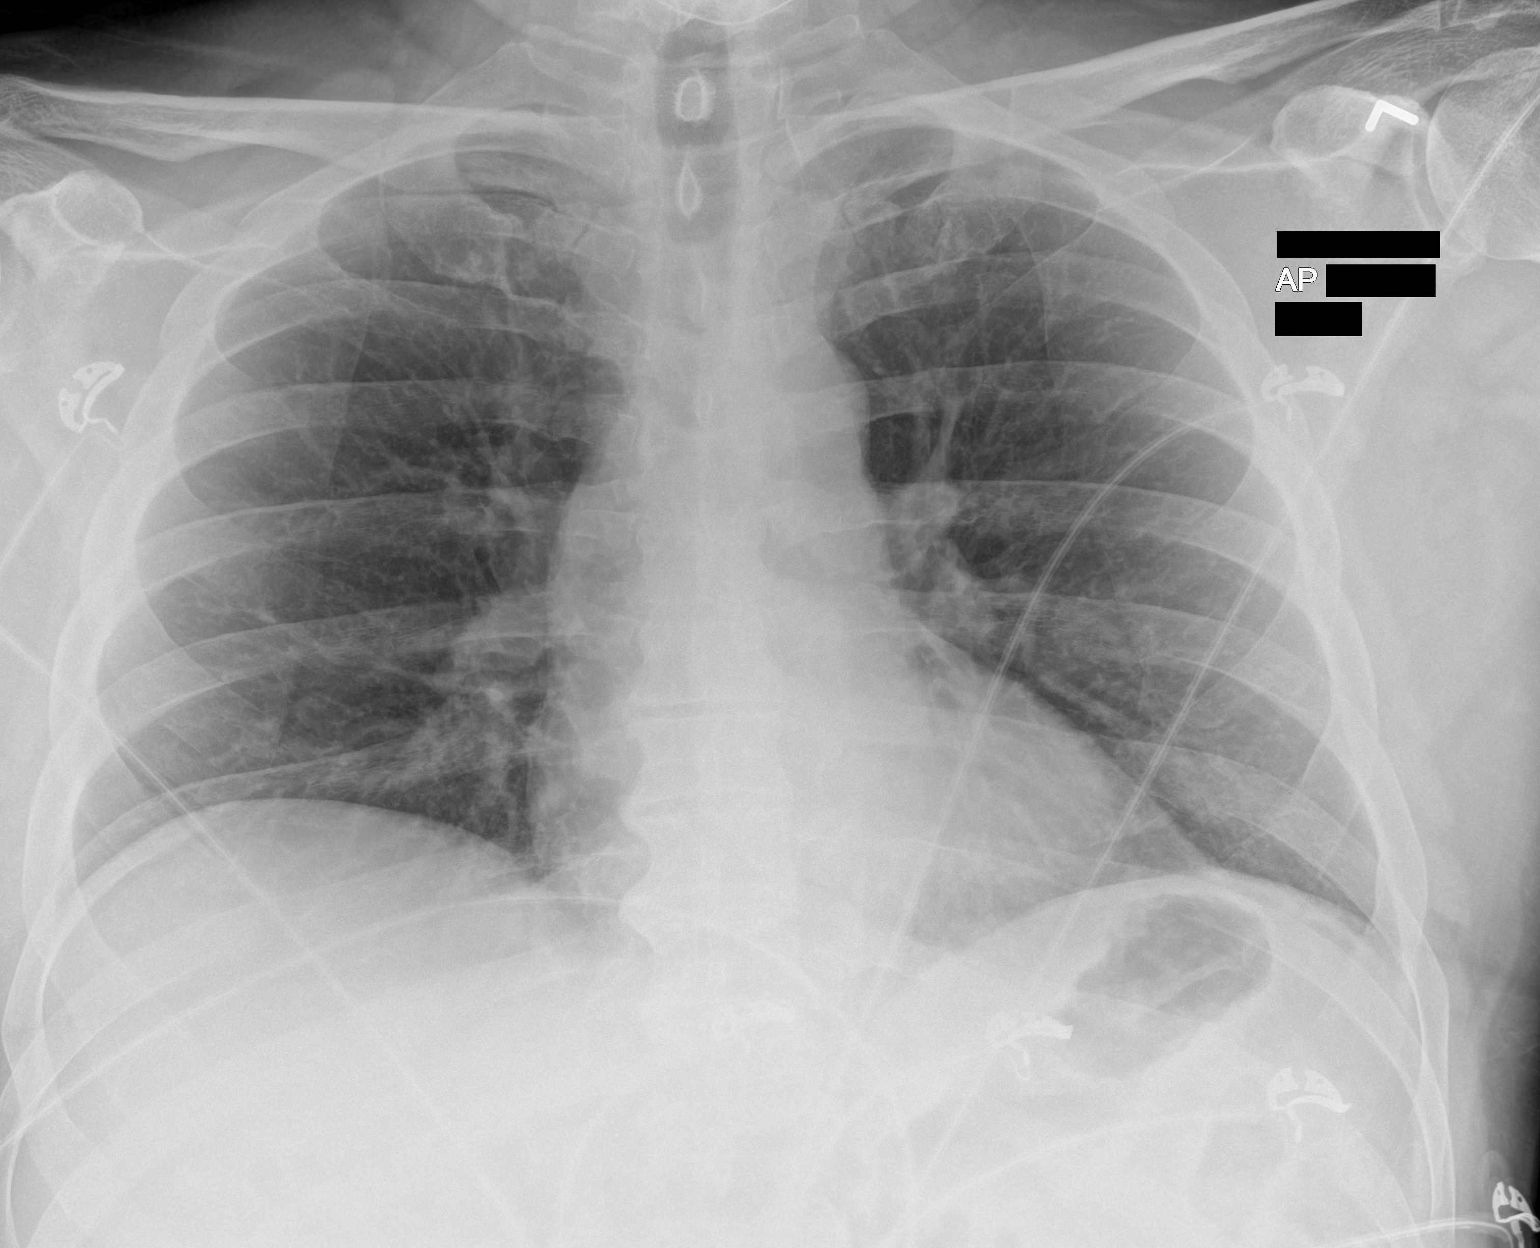

[1 of 1 positions shown; findings below may reference images not displayed]

FINDINGS: [S0] hours. Low lung volumes. Stable patchy airspace opacity at the
left base. Right lung remains clear. The cardiopericardial
silhouette is within normal limits for size. The visualized bony
structures of the thorax show no acute abnormality. Telemetry leads
overlie the chest.
IMPRESSION: Stable.  Mild patchy airspace opacity at the left base.

## 2020-03-28 IMAGING — MR MR [PERSON_NAME] HEAD
1 series · 48 of 48 positions shown · non-contrast
Comparison: Concurrently performed MRI and MRA of the head.
Non-contrast head CT performed earlier the same day [DATE].

CLINICAL DATA: Dural venous sinus thrombosis suspected.

EXAM:
MR VENOGRAM OF THE HEAD WITHOUT CONTRAST
TECHNIQUE: Angiographic images of the intracranial venous structures were
obtained using MRV technique without intravenous contrast.

[Series 5: tof_2d_paracor · coronal · 2.5mm · 0.98mm/px · 48 of 128 slices shown]
[im 1/128]
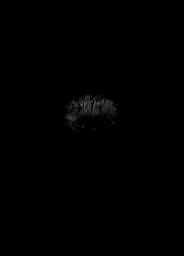
[im 3/128]
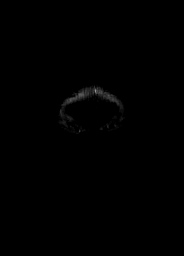
[im 6/128]
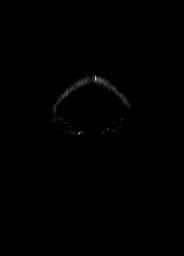
[im 9/128]
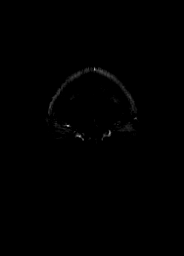
[im 11/128]
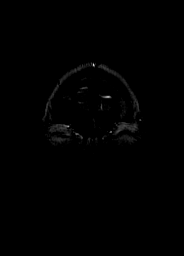
[im 14/128]
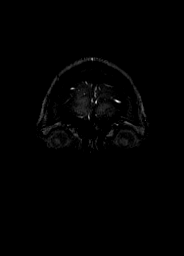
[im 17/128]
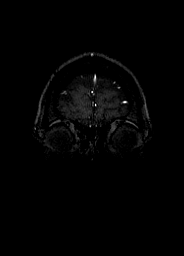
[im 19/128]
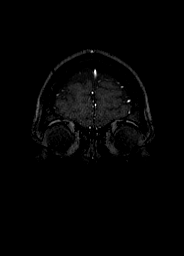
[im 22/128]
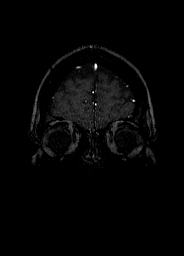
[im 25/128]
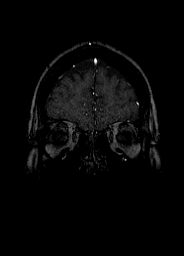
[im 28/128]
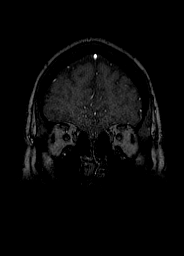
[im 30/128]
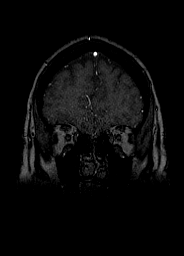
[im 33/128]
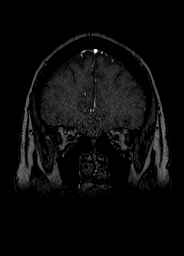
[im 36/128]
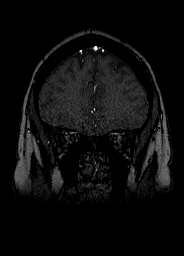
[im 38/128]
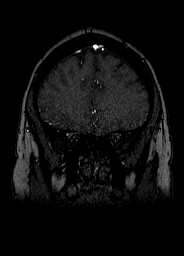
[im 41/128]
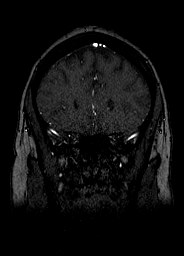
[im 44/128]
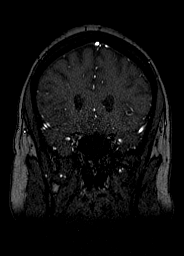
[im 46/128]
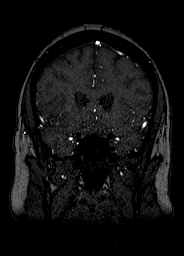
[im 49/128]
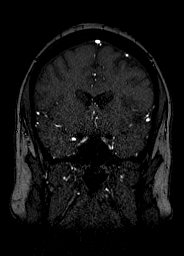
[im 52/128]
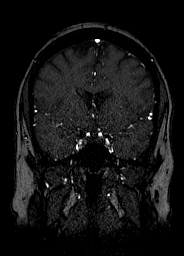
[im 55/128]
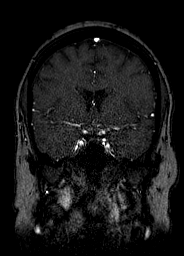
[im 57/128]
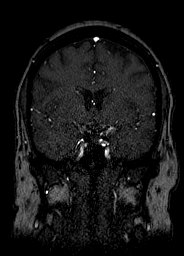
[im 60/128]
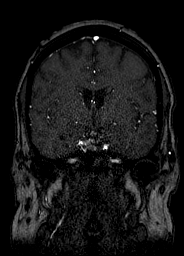
[im 63/128]
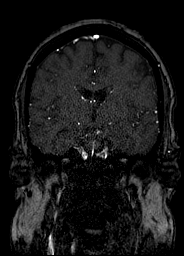
[im 65/128]
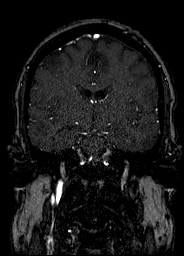
[im 68/128]
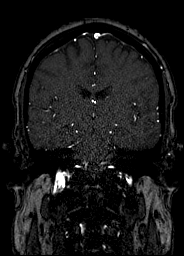
[im 71/128]
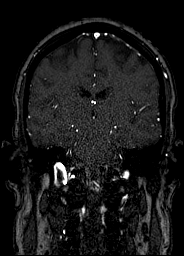
[im 73/128]
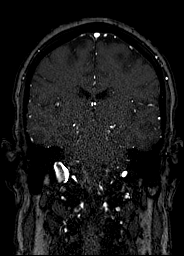
[im 76/128]
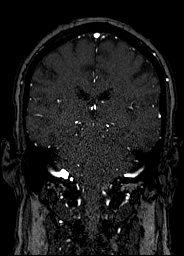
[im 79/128]
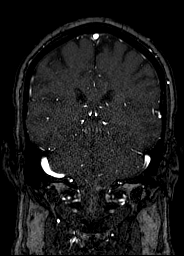
[im 82/128]
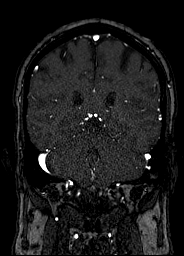
[im 84/128]
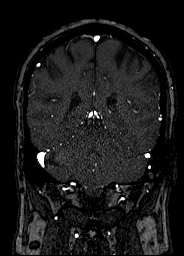
[im 87/128]
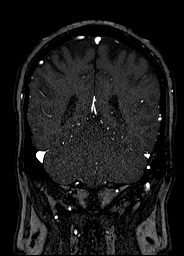
[im 90/128]
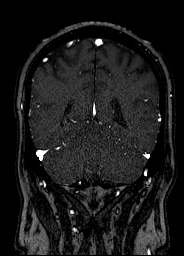
[im 92/128]
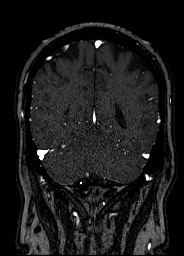
[im 95/128]
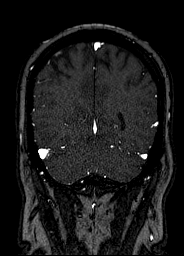
[im 98/128]
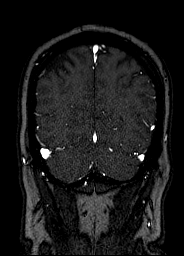
[im 100/128]
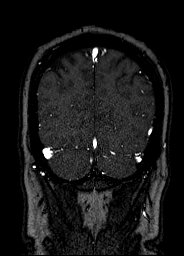
[im 103/128]
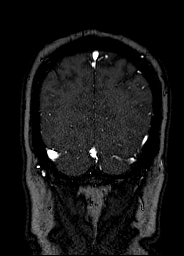
[im 106/128]
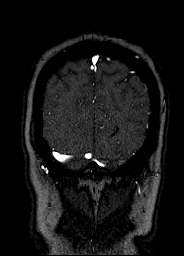
[im 109/128]
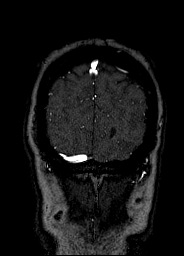
[im 111/128]
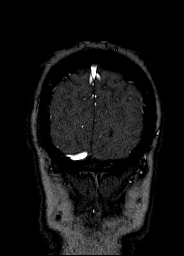
[im 114/128]
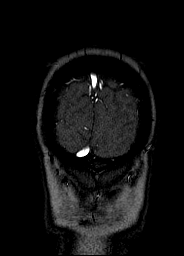
[im 117/128]
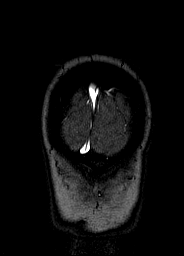
[im 119/128]
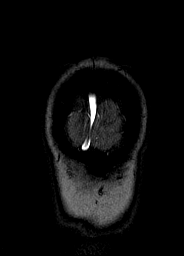
[im 122/128]
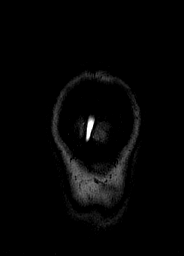
[im 125/128]
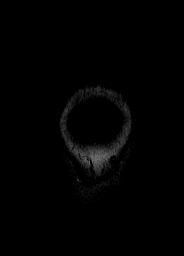
[im 128/128]
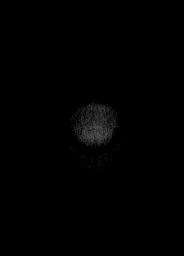

[48 of 48 positions shown; findings below may reference images not displayed]

FINDINGS: The left transverse dural venous sinus is hypoplastic but patent.
The superior sagittal sinus, internal cerebral veins, vein of MAYRA,
straight sinus, right transverse sinus and sigmoid sinuses are
patent. No appreciable intracranial venous thrombosis.
IMPRESSION: No evidence of intracranial venous thrombosis.

Hypoplastic left transverse dural venous sinus.

## 2020-03-28 IMAGING — CT CT ANGIO CHEST
2 of 6 series · 18 of 46 positions shown · IV contrast (APPLIED)
Comparison: [DATE] chest radiograph and prior studies

CLINICAL DATA: 59-year-old male with cough and chest pain. COVID
positive.

EXAM:
CT ANGIOGRAPHY CHEST WITH CONTRAST
TECHNIQUE: Multidetector CT imaging of the chest was performed using the
standard protocol during bolus administration of intravenous
contrast. Multiplanar CT image reconstructions and MIPs were
obtained to evaluate the vascular anatomy.
CONTRAST:  100mL OMNIPAQUE IOHEXOL 350 MG/ML SOLN

[Series 5: thins · axial · 0.80mm/px · z∈[-526,-253]mm · 15 of 300 slices shown]
[im 14/300  lung]
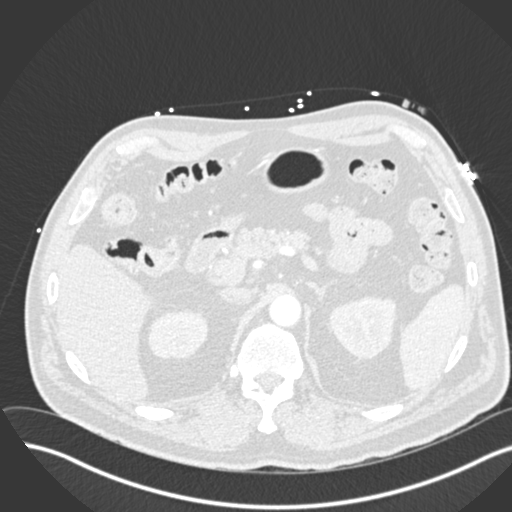
[im 40/300  soft-tissue]
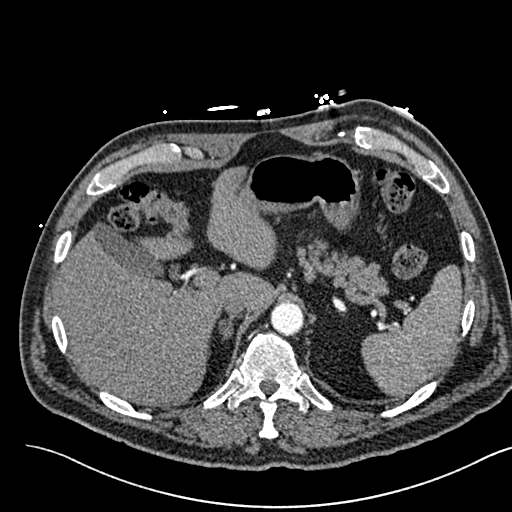
[im 53/300  lung]
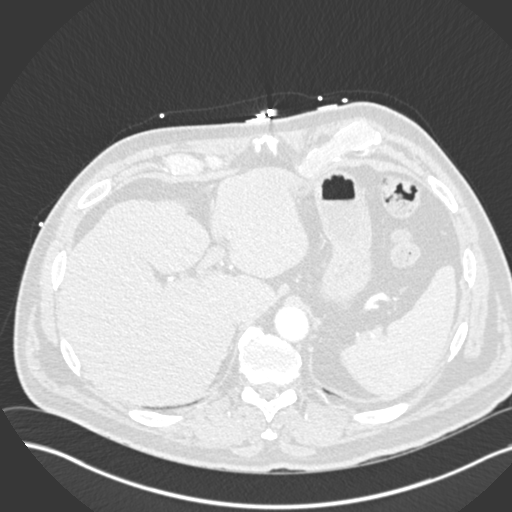
[im 79/300  soft-tissue]
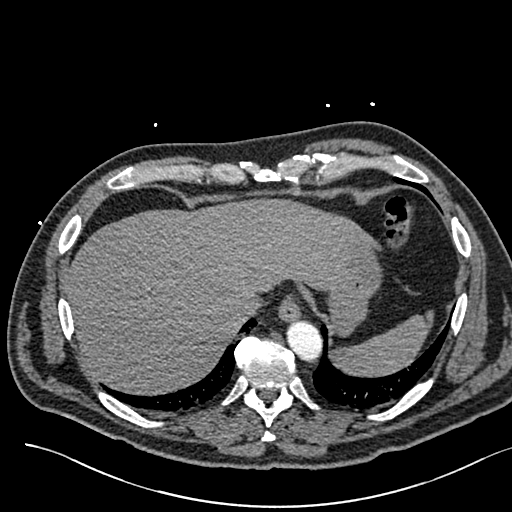
[im 92/300  lung]
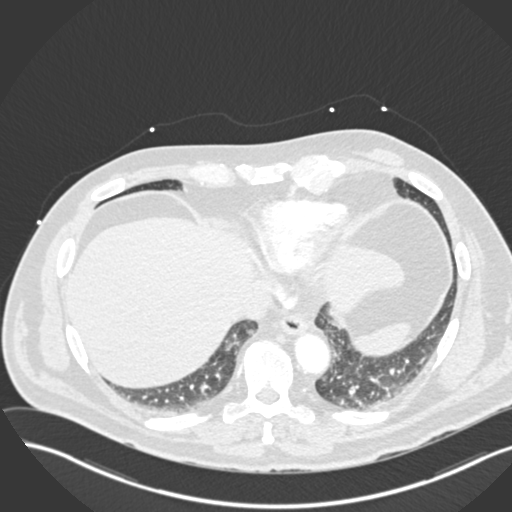
[im 118/300  soft-tissue]
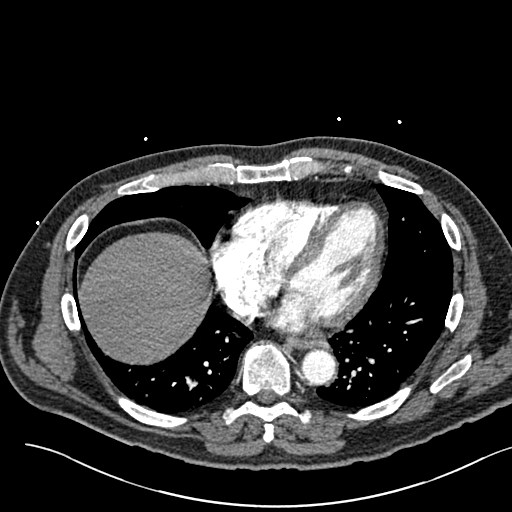
[im 131/300  lung]
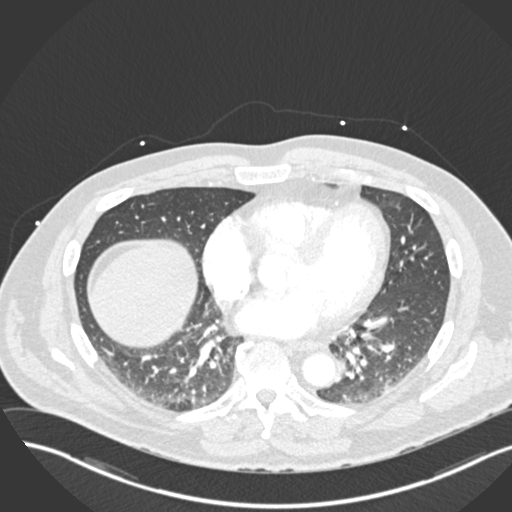
[im 157/300  soft-tissue]
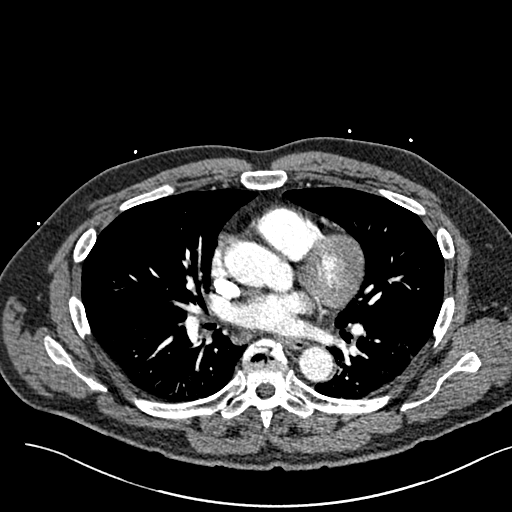
[im 170/300  lung]
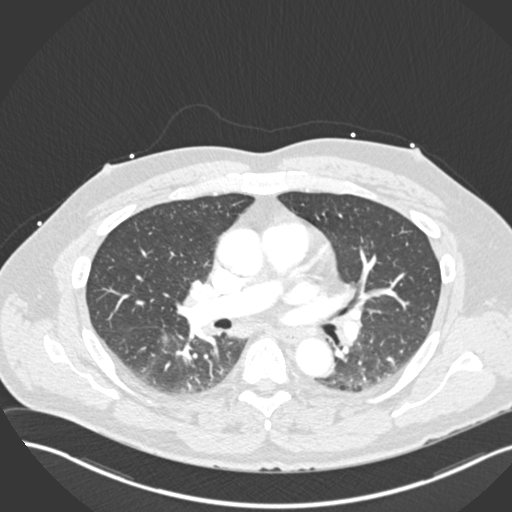
[im 183/300  soft-tissue]
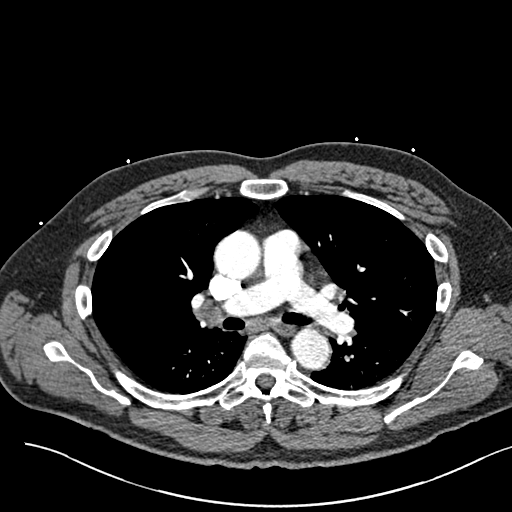
[im 209/300  lung]
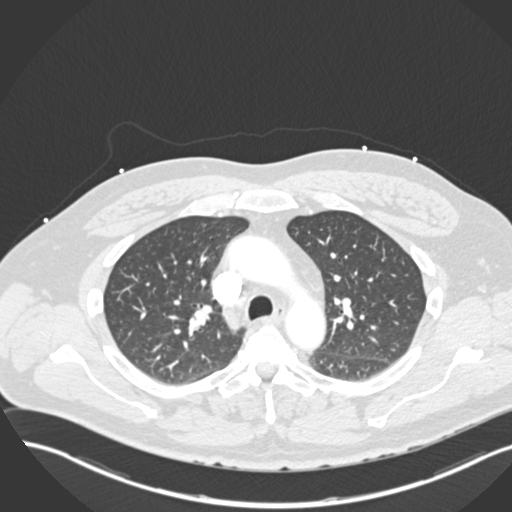
[im 222/300  soft-tissue]
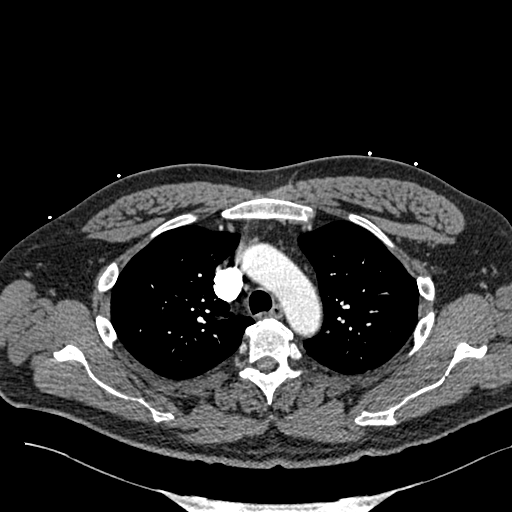
[im 248/300  lung]
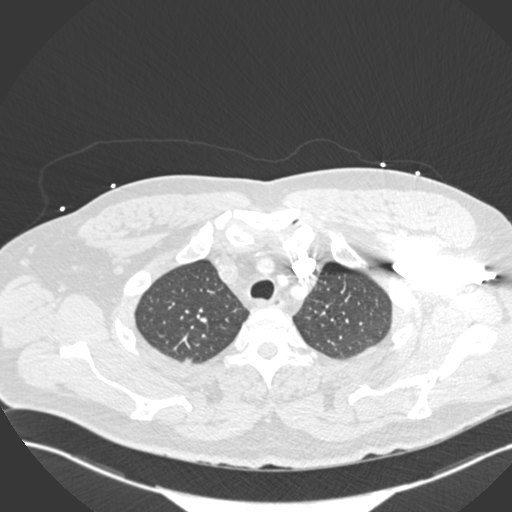
[im 261/300  soft-tissue]
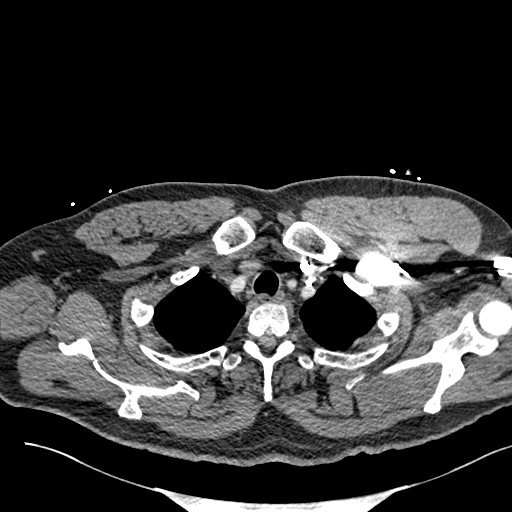
[im 287/300  lung]
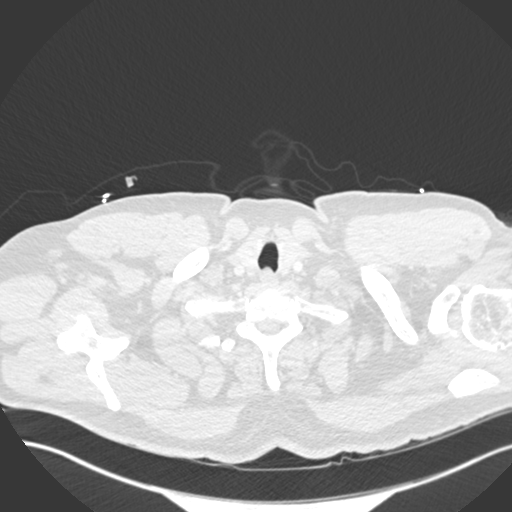

[Series 7: coronal mpr · coronal · 0.63mm/px · 3 of 82 slices shown]
[im 21/82  soft-tissue]
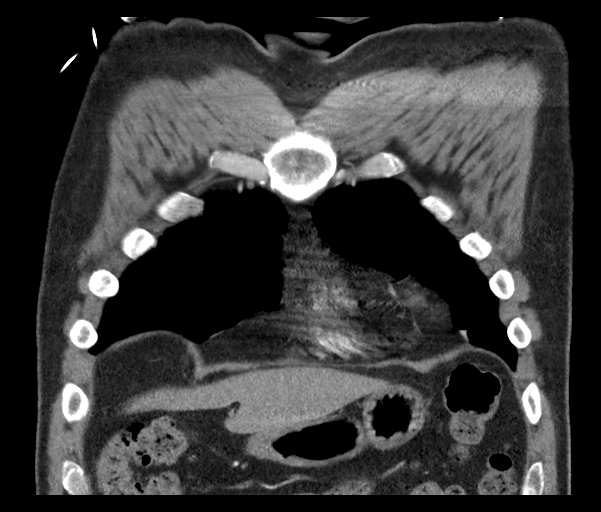
[im 41/82  soft-tissue]
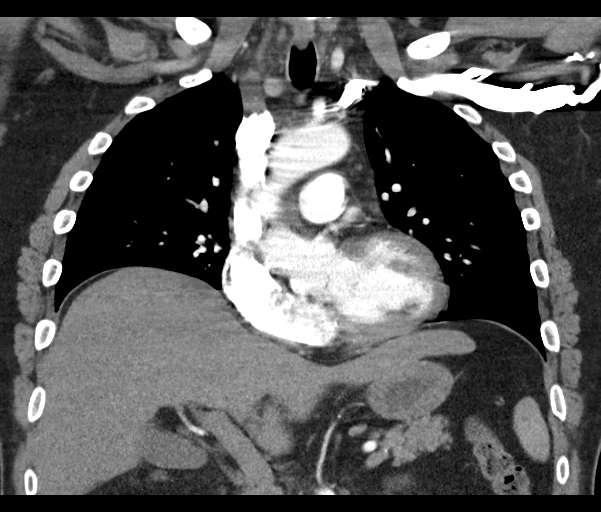
[im 61/82  soft-tissue]
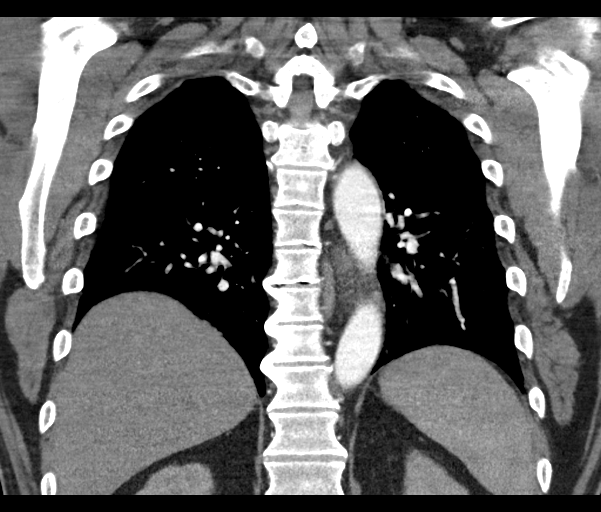

[18 of 46 positions shown; findings below may reference images not displayed]

FINDINGS: Cardiovascular: This is a technically satisfactory study. No
pulmonary emboli are identified. Heart size is normal. No thoracic
aortic aneurysm, dissection or pericardial effusion.

Mediastinum/Nodes: No enlarged mediastinal, hilar, or axillary lymph
nodes. Thyroid gland, trachea, and esophagus demonstrate no
significant findings.

Lungs/Pleura: Small focal area of airspace disease within the RIGHT
UPPER lobe (image 24: Series 6) is noted as well as mild bibasilar
ground-glass/airspace opacities, suspicious for infection/pneumonia.

No discrete mass, suspicious nodule, consolidation, pleural effusion
or pneumothorax noted.

Upper Abdomen: No acute abnormality.

Musculoskeletal: No acute or suspicious bony abnormality.

Review of the MIP images confirms the above findings.
IMPRESSION: 1. Small focal area of airspace disease within the RIGHT UPPER lobe
with mild bibasilar ground-glass/airspace opacities, suspicious for
infection/pneumonia.
2. No evidence of pulmonary emboli or thoracic aortic aneurysm.

## 2020-03-28 MED ORDER — MECLIZINE HCL 25 MG PO TABS
50.0000 mg | ORAL_TABLET | Freq: Once | ORAL | Status: AC
  Administered 2020-03-28: 50 mg via ORAL
  Filled 2020-03-28: qty 2

## 2020-03-28 MED ORDER — IOHEXOL 350 MG/ML SOLN
75.0000 mL | Freq: Once | INTRAVENOUS | Status: AC | PRN
  Administered 2020-03-28: 100 mL via INTRAVENOUS

## 2020-03-28 NOTE — ED Notes (Signed)
Pt with brief syncopal episode in triage, diaphoretic; charge nurse notified bed needed

## 2020-03-28 NOTE — ED Provider Notes (Signed)
I assumed care of this patient approximate 0 700. Please see outgoing providers note for full details regarding their initial evaluation assessment. In brief patient is a 59 year old male recently diagnosed with Covid and hospitalized 8/31-9/2 for management of Covid and hyponatremia who presents again for assessment of persistent vertigo as well as some blood-tinged sputum and a syncopal episode that occurred in triage.  CTA chest unremarkable for: IMPRESSION:  1. Small focal area of airspace disease within the RIGHT UPPER lobe  with mild bibasilar ground-glass/airspace opacities, suspicious for  infection/pneumonia.  2. No evidence of pulmonary emboli or thoracic aortic aneurysm.  CT Head remarkable for: Unremarkable noncontrast head CT.  No other acute intrathoracic process. EKG and 2 troponins obtained over 2 h are not reassuring that patient is not currently experiencing ACS. CT head is unremarkable. However given persistence of vertigo plan is to obtain MRI studies to assess for evidence of CVA versus sinus thrombosis. Plan is to reassess patient after the studies have been completed.  IMPRESSION: MRI brain: 1. No evidence of acute intracranial abnormality, including acute infarction. 2. Unremarkable MRI appearance of the brain for age. 3. Mild ethmoid and left frontal sinus mucosal thickening. 4. Left mastoid effusion. MRA head: 1. No intracranial large vessel occlusion. 2. 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 3. 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 However, there is a sizable left posterior communicating artery which is patent without stenosis. 4. No other high-grade proximal arterial stenosis is identified. MR Venogram IMPRESSION: No evidence of intracranial venous thrombosis. Hypoplastic left  transverse dural venous sinus.  On reassessment patient is resting comfortably in bed with stable vital signs.  He states he felt much better than he did earlier.  I did explain to the patient that I was concerned he may have been significantly dehydrated early and encouraged healthy hydration with electrolyte and carbohydrate-containing beverages or broth at home.  He was observed to ambulate with steady gait.  Given stable vital signs with reassuring work-up as noted above I believe he is safe for discharge with plan for outpatient PCP follow-up.  I also discussed patient's expected clinical course and plan of care with his wife.  Discharged stable condition.  Strict return precautions provided and discussed.   Gilles Chiquito, MD 03/28/20 [phone]

## 2020-03-28 NOTE — ED Triage Notes (Addendum)
Pt to triage via w/c, brought in by EMS from home; c/o dizziness tonight and prod cough "red sputum" and some mid CP; pt +COVID, seen recently; st was told his potassium was low; pt with normal K+ noted in chart

## 2020-03-28 NOTE — ED Notes (Signed)
Patient transported to CT 

## 2020-03-28 NOTE — ED Provider Notes (Signed)
[facility] Emergency Department Provider Note   ____________________________________________   First MD Initiated Contact with Patient 03/28/20 [phone]     (approximate)  I have reviewed the triage vital signs and the nursing notes.   HISTORY  Chief Complaint Dizziness    HPI Eric Pierce is a 59 y.o. male brought to the ED from home via EMS with a chief complaint of hemoptysis, chest pain and dizziness.  Patient had a syncopal episode while being triaged and was brought straight back to a treatment room.  Patient was recently hospitalized 8/31-9/2 for hyponatremia, syncope, COVID-19.  He was treated with 2 days of Remdesivir and set up for IV Remdesivir infusions as an outpatient.  States he has been getting IV Remdesivir as scheduled.  He was treated with IV Decadron in the hospital and prescribed oral Decadron as an outpatient.  States he did fine after discharge until this evening when both he and his wife noted cognitive deficits such as writing numbers backwards, being forgetful.  Denies slurred speech or extremity weakness/numbness.  Reports near syncope at home secondary to dizziness.  Also notes hemoptysis tonight.  Denies abdominal pain, nausea, vomiting or diarrhea.  Patient is unvaccinated against COVID-19.      Past Medical History:  Diagnosis Date  . Anxiety   . Meniere disease   . Post-operative nausea and vomiting     Patient Active Problem List   Diagnosis Date Noted  . Benign prostatic hyperplasia with weak urinary stream   . Anxiety and depression   . Hyponatremia 03/26/2020  . Pneumonia due to COVID-19 virus 03/26/2020  . HTN (hypertension) 02/21/2020  . Meniere disease 04/19/2011  . Hyperlipidemia 08/17/2007  . Anxiety state 11/01/2006    Past Surgical History:  Procedure Laterality Date  . ENDOLYMPHATIC [facility] OPERATION  02/2011 and 2013   ENT - Dr. Joneen Roach  . MASTOIDECTOMY  2013  . SHOULDER SURGERY  04/2002   left     Prior to Admission medications   Medication Sig Start Date End Date Taking? Authorizing Provider  albuterol (VENTOLIN HFA) 108 (90 Base) MCG/ACT inhaler Inhale 2 puffs into the lungs every 6 (six) hours. 03/27/20   Alford Highland, MD  ascorbic acid (VITAMIN C) 500 MG tablet Take 1 tablet (500 mg total) by mouth daily. 03/28/20   Alford Highland, MD  aspirin (ASPIRIN LOW DOSE) 81 MG tablet Take 81 mg by mouth daily.      [provider]  buPROPion (WELLBUTRIN XL) 300 MG 24 hr tablet Take 1qd (Plz schedule virtual visit for future fills) 08/02/19   Dianne Dun, MD  dexamethasone (DECADRON) 6 MG tablet Take 1 tablet (6 mg total) by mouth daily. 03/27/20   Alford Highland, MD  diazepam (VALIUM) 5 MG tablet Take 1 tablet by mouth every 8 (eight) hours as needed.  10/15/13   [provider]  diphenhydrAMINE (BENADRYL) 25 MG tablet Take 25 mg by mouth at bedtime.    [provider]  ezetimibe-simvastatin (VYTORIN) 10-20 MG tablet TAKE 1 TABLET BY MOUTH EVERYDAY AT BEDTIME 07/17/19   Dianne Dun, MD  lisinopril (ZESTRIL) 10 MG tablet TAKE 1 TABLET BY MOUTH EVERY DAY 03/26/20   Lorre Munroe, NP  meclizine (ANTIVERT) 25 MG tablet Take 25 mg by mouth as needed for dizziness.    [provider]  montelukast (SINGULAIR) 10 MG tablet Take 10 mg by mouth at bedtime.  05/22/19   [provider]  Omega-3 Fatty Acids (  FISH OIL) 1000 MG CAPS Take 2 capsules by mouth daily.     [provider]  ondansetron (ZOFRAN ODT) 4 MG disintegrating tablet Take 1 tablet (4 mg total) by mouth every 8 (eight) hours as needed for nausea or vomiting. 03/24/20   Lorre Munroe, NP  tamsulosin (FLOMAX) 0.4 MG CAPS capsule Take 1 capsule (0.4 mg total) by mouth daily after supper. 03/27/20   Alford Highland, MD  zinc sulfate 220 (50 Zn) MG capsule Take 1 capsule (220 mg total) by mouth daily. 03/28/20   Alford Highland, MD    Allergies Hydrocodone and Morphine and  related  Family History  Problem Relation Age of Onset  . Heart disease Father   . Cancer Father        prostate  . Heart disease Brother   . Colon cancer Paternal Grandmother   . Prostate cancer Paternal Grandfather     Social History Social History   Tobacco Use  . Smoking status: Never Smoker  . Smokeless tobacco: Never Used  Substance Use Topics  . Alcohol use: No    Alcohol/week: 0.0 standard drinks  . Drug use: No    Review of Systems  Constitutional: No fever/chills Eyes: No visual changes. ENT: No sore throat. Cardiovascular: Positive for chest pain. Respiratory: Positive for hemoptysis.  Denies shortness of breath. Gastrointestinal: No abdominal pain.  No nausea, no vomiting.  No diarrhea.  No constipation. Genitourinary: Negative for dysuria. Musculoskeletal: Negative for back pain. Skin: Negative for rash. Neurological: Positive for dizziness and syncope.  Negative for headaches, focal weakness or numbness.   ____________________________________________   PHYSICAL EXAM:  VITAL SIGNS: ED Triage Vitals  Enc Vitals Group     BP 03/28/20 0356 122/79     Pulse Rate 03/28/20 0356 95     Resp 03/28/20 0356 20     Temp 03/28/20 0356 98.7 F (37.1 C)     Temp Source 03/28/20 0356 Oral     SpO2 03/28/20 0356 97 %     Weight 03/28/20 0357 235 lb (106.6 kg)     Height 03/28/20 0357 6' (1.829 m)     Head Circumference --      Peak Flow --      Pain Score 03/28/20 0407 5     Pain Loc --      Pain Edu? --      Excl. in GC? --     Constitutional: Alert and oriented. Well appearing and in mild to moderate acute distress. Eyes: Conjunctivae are normal. PERRL. EOMI. Head: Atraumatic. Nose: No congestion/rhinnorhea. Mouth/Throat: Mucous membranes are mildly dry.   Neck: No stridor.   Cardiovascular: Normal rate, regular rhythm. Grossly normal heart sounds.  Good peripheral circulation. Respiratory: Normal respiratory effort.  No retractions.  Equal rise  and fall. Gastrointestinal: Soft and nontender to D palpation. No distention. No abdominal bruits. No CVA tenderness. Musculoskeletal: No lower extremity tenderness nor edema.  No joint effusions. Neurologic: Alert and oriented x3.  CN II-XII grossly intact.  Got a little confused trying to recount his remdesivir schedule. Normal speech and language. No gross focal neurologic deficits are appreciated.  Skin:  Skin is warm, dry and intact. No rash noted.  No petechiae. Psychiatric: Mood and affect are normal. Speech and behavior are normal.  ____________________________________________   LABS (all labs ordered are listed, but only abnormal results are displayed)  Labs Reviewed  CBC WITH DIFFERENTIAL/PLATELET - Abnormal; Notable for the following components:  Result Value   WBC 13.1 (*)    MCHC 36.6 (*)    Neutro Abs 9.2 (*)    Monocytes Absolute 1.4 (*)    All other components within normal limits  COMPREHENSIVE METABOLIC PANEL - Abnormal; Notable for the following components:   Sodium 132 (*)    Chloride 96 (*)    Glucose, Bld 148 (*)    Calcium 8.8 (*)    All other components within normal limits  FERRITIN - Abnormal; Notable for the following components:   Ferritin 436 (*)    All other components within normal limits  FIBRINOGEN - Abnormal; Notable for the following components:   Fibrinogen 500 (*)    All other components within normal limits  BRAIN NATRIURETIC PEPTIDE  FIBRIN DERIVATIVES D-DIMER ([facility] ONLY)  LACTATE DEHYDROGENASE  MAGNESIUM  C-REACTIVE PROTEIN  HEPATITIS B SURFACE ANTIGEN  PROCALCITONIN  ABO/RH  TROPONIN I (HIGH SENSITIVITY)  TROPONIN I (HIGH SENSITIVITY)   ____________________________________________  EKG  ED ECG REPORT I, Orabelle Rylee J, the attending physician, personally viewed and interpreted this ECG.   Date: 03/28/2020  EKG Time: 0412  Rate: 61  Rhythm: normal EKG, normal sinus rhythm  Axis: Normal  Intervals:none  ST&T Change:  Nonspecific  ____________________________________________  RADIOLOGY  ED MD interpretation: Left base patchy airspace opacity on chest x-ray; CT head unremarkable, CT chest no PE, airspace disease within right upper lobe with mild bibasilar groundglass opacities  Official radiology report(s): CT Head Wo Contrast  Result Date: 03/28/2020 CLINICAL DATA:  59 year old male with dizziness and altered mental status. EXAM: CT HEAD WITHOUT CONTRAST TECHNIQUE: Contiguous axial images were obtained from the base of the skull through the vertex without intravenous contrast. COMPARISON:  10/16/2015 maxillofacial CT FINDINGS: Brain: No evidence of acute infarction, hemorrhage, hydrocephalus, extra-axial collection or mass lesion/mass effect. Vascular: No hyperdense vessel or unexpected calcification. Skull: Normal. Negative for fracture or focal lesion. Sinuses/Orbits: No acute finding. Other: None. IMPRESSION: Unremarkable noncontrast head CT. Electronically Signed   By: Harmon PierJeffrey  Hu M.D.   On: 03/28/2020 05:48   CT Angio Chest PE W/Cm &/Or Wo Cm  Result Date: 03/28/2020 CLINICAL DATA:  59 year old male with cough and chest pain. COVID positive. EXAM: CT ANGIOGRAPHY CHEST WITH CONTRAST TECHNIQUE: Multidetector CT imaging of the chest was performed using the standard protocol during bolus administration of intravenous contrast. Multiplanar CT image reconstructions and MIPs were obtained to evaluate the vascular anatomy. CONTRAST:  100mL OMNIPAQUE IOHEXOL 350 MG/ML SOLN COMPARISON:  03/28/2020 chest radiograph and prior studies FINDINGS: Cardiovascular: This is a technically satisfactory study. No pulmonary emboli are identified. Heart size is normal. No thoracic aortic aneurysm, dissection or pericardial effusion. Mediastinum/Nodes: No enlarged mediastinal, hilar, or axillary lymph nodes. Thyroid gland, trachea, and esophagus demonstrate no significant findings. Lungs/Pleura: Small focal area of airspace disease  within the RIGHT UPPER lobe (image 24: Series 6) is noted as well as mild bibasilar ground-glass/airspace opacities, suspicious for infection/pneumonia. No discrete mass, suspicious nodule, consolidation, pleural effusion or pneumothorax noted. Upper Abdomen: No acute abnormality. Musculoskeletal: No acute or suspicious bony abnormality. Review of the MIP images confirms the above findings. IMPRESSION: 1. Small focal area of airspace disease within the RIGHT UPPER lobe with mild bibasilar ground-glass/airspace opacities, suspicious for infection/pneumonia. 2. No evidence of pulmonary emboli or thoracic aortic aneurysm. Electronically Signed   By: Harmon PierJeffrey  Hu M.D.   On: 03/28/2020 05:56   DG Chest Port 1 View  Result Date: 03/28/2020 CLINICAL DATA:  Dizziness with productive cough and  chest pain. COVID positive. EXAM: PORTABLE CHEST 1 VIEW COMPARISON:  03/25/2020 FINDINGS: 0454 hours. Low lung volumes. Stable patchy airspace opacity at the left base. Right lung remains clear. The cardiopericardial silhouette is within normal limits for size. The visualized bony structures of the thorax show no acute abnormality. Telemetry leads overlie the chest. IMPRESSION: Stable.  Mild patchy airspace opacity at the left base. Electronically Signed   By: Kennith Center M.D.   On: 03/28/2020 05:09    ____________________________________________   PROCEDURES  Procedure(s) performed (including Critical Care):  .1-3 Lead EKG Interpretation Performed by: Irean Hong, MD Authorized by: Irean Hong, MD     Interpretation: normal     ECG rate:  60   ECG rate assessment: normal     Rhythm: sinus rhythm     Ectopy: none     Conduction: normal   Comments:     Patient placed on cardiac monitor to evaluate for arrhythmias     ____________________________________________   INITIAL IMPRESSION / ASSESSMENT AND PLAN / ED COURSE  As part of my medical decision making, I reviewed the following data within the  electronic MEDICAL RECORD NUMBER Nursing notes reviewed and incorporated, Labs reviewed, EKG interpreted, Old chart reviewed, Radiograph reviewed and Notes from prior ED visits     Eric Pierce was evaluated in Emergency Department on 03/28/2020 for the symptoms described in the history of present illness. He was evaluated in the context of the global COVID-19 pandemic, which necessitated consideration that the patient might be at risk for infection with the SARS-CoV-2 virus that causes COVID-19. Institutional protocols and algorithms that pertain to the evaluation of patients at risk for COVID-19 are in a state of rapid change based on information released by regulatory bodies including the CDC and federal and state organizations. These policies and algorithms were followed during the patient's care in the ED.    59 year old Covid+ male presenting with cognitive deficits, dizziness, syncope, hemoptysis and chest pain. Differential diagnosis includes, but is not limited to, ACS, aortic dissection, pulmonary embolism, cardiac tamponade, pneumothorax, pneumonia, pericarditis, myocarditis, GI-related causes including esophagitis/gastritis, and musculoskeletal chest wall pain.    We will obtain lab work, CT head to evaluate for ICH, CVA, CT chest to evaluate for PE.  IV fluid resuscitation for syncopal episode.   Clinical Course as of Mar 29 651  Fri Mar 28, 2020  7829 Patient orthostatic on heart rate.  Could not tolerate ambulation trial secondary to dizziness.  Required 2 nurse assist.  Room air saturations 93 to 94%.   [JS]  [id number] Laboratory results, CT scans unremarkable.  Sodium much improved from prior.  Plan to trial meclizine for dizziness, repeat troponin and reassess to determine disposition.   [JS]  [id number] Seems like dizziness is patient's chief complaints.  I personally reviewed patient's records and do not see an MRI in his chart.  Will obtain an MRI, MRA to assess circulation, MRV to rule out  cavernous sinus thrombosis given patient's Covid positive status.   [JS]  [id number] Care transferred to Dr. Katrinka Blazing at change of shift pending MRIs, repeat troponin, and reassessment.    [JS]    Clinical Course User Index [JS] Irean Hong, MD     ____________________________________________   FINAL CLINICAL IMPRESSION(S) / ED DIAGNOSES  Final diagnoses:  Dizziness  Syncope, unspecified syncope type  Orthostasis  Pneumonia due to COVID-19 virus     ED Discharge Orders    None  Note:  This document was prepared using Dragon voice recognition software and may include unintentional dictation errors.   Irean Hong, MD 03/28/20 [phone]

## 2020-03-28 NOTE — ED Notes (Signed)
Pt taken to MRI at this time

## 2020-03-28 NOTE — ED Notes (Signed)
Pt back from MRI 

## 2020-03-28 NOTE — ED Notes (Signed)
Patient ambulated with an unsteady gait. Patient dizzy during ambulation. Patient made 2 laps around the bed with assistance and was placed back in bed. Patient's oxygen saturation decreased from 100% to 93% on RA. Patient's respiration's increased from 14-22.

## 2020-03-29 ENCOUNTER — Ambulatory Visit (HOSPITAL_COMMUNITY)
Admit: 2020-03-29 | Discharge: 2020-03-29 | Disposition: A | Discharge status: Home / Self Care | Payer: BC Managed Care – PPO | Attending: Pulmonary Disease | Admitting: Pulmonary Disease

## 2020-03-29 DIAGNOSIS — J1282 Pneumonia due to coronavirus disease 2019: Secondary | ICD-10-CM | POA: Diagnosis not present

## 2020-03-29 DIAGNOSIS — U071 COVID-19: Secondary | ICD-10-CM | POA: Diagnosis not present

## 2020-03-29 MED ORDER — SODIUM CHLORIDE 0.9 % IV SOLN
100.0000 mg | Freq: Once | INTRAVENOUS | Status: AC
  Administered 2020-03-29: 100 mg via INTRAVENOUS
  Filled 2020-03-29: qty 20

## 2020-03-29 MED ORDER — EPINEPHRINE 0.3 MG/0.3ML IJ SOAJ: 0.3000 mg | Freq: Once | INTRAMUSCULAR | Status: DC | PRN

## 2020-03-29 MED ORDER — DIPHENHYDRAMINE HCL 50 MG/ML IJ SOLN: 50.0000 mg | Freq: Once | INTRAMUSCULAR | Status: DC | PRN

## 2020-03-29 MED ORDER — METHYLPREDNISOLONE SODIUM SUCC 125 MG IJ SOLR: 125.0000 mg | Freq: Once | INTRAMUSCULAR | Status: DC | PRN

## 2020-03-29 MED ORDER — SODIUM CHLORIDE 0.9 % IV SOLN: INTRAVENOUS | Status: DC | PRN

## 2020-03-29 MED ORDER — ALBUTEROL SULFATE HFA 108 (90 BASE) MCG/ACT IN AERS: 2.0000 | INHALATION_SPRAY | Freq: Once | RESPIRATORY_TRACT | Status: DC | PRN

## 2020-03-29 MED ORDER — FAMOTIDINE IN NACL 20-0.9 MG/50ML-% IV SOLN: 20.0000 mg | Freq: Once | INTRAVENOUS | Status: DC | PRN

## 2020-03-29 NOTE — Discharge Instructions (Signed)
10 Things You Can Do to Manage Your COVID-19 Symptoms at Home If you have possible or confirmed COVID-19: 1. Stay home from work and school. And stay away from other public places. If you must go out, avoid using any kind of public transportation, ridesharing, or taxis. 2. Monitor your symptoms carefully. If your symptoms get worse, call your healthcare provider immediately. 3. Get rest and stay hydrated. 4. If you have a medical appointment, call the healthcare provider ahead of time and tell them that you have or may have COVID-19. 5. For medical emergencies, call 911 and notify the dispatch personnel that you have or may have COVID-19. 6. Cover your cough and sneezes with a tissue or use the inside of your elbow. 7. Wash your hands often with soap and water for at least 20 seconds or clean your hands with an alcohol-based hand sanitizer that contains at least 60% alcohol. 8. As much as possible, stay in a specific room and away from other people in your home. Also, you should use a separate bathroom, if available. If you need to be around other people in or outside of the home, wear a mask. 9. Avoid sharing personal items with other people in your household, like dishes, towels, and bedding. 10. Clean all surfaces that are touched often, like counters, tabletops, and doorknobs. Use household cleaning sprays or wipes according to the label instructions. cdc.gov/coronavirus 01/24/2019 This information is not intended to replace advice given to you by your health care provider. Make sure you discuss any questions you have with your health care provider. Document Revised: 06/28/2019 Document Reviewed: 06/28/2019 Elsevier Patient Education  2020 Elsevier Inc.  

## 2020-03-29 NOTE — Progress Notes (Signed)
  Diagnosis: COVID-19  Physician: Dr Wright  Procedure: [facility] Med: remdesivir infusion - Provided patient with remdesivir fact sheet for patients, parents and caregivers prior to infusion.  Complications: No immediate complications noted.  Discharge: Discharged home   Shaynah Hund Albarece 03/29/2020   

## 2020-03-30 ENCOUNTER — Ambulatory Visit (HOSPITAL_COMMUNITY)
Admit: 2020-03-30 | Discharge: 2020-03-30 | Disposition: A | Discharge status: Home / Self Care | Payer: BC Managed Care – PPO | Attending: Pulmonary Disease | Admitting: Pulmonary Disease

## 2020-03-30 DIAGNOSIS — U071 COVID-19: Secondary | ICD-10-CM | POA: Diagnosis not present

## 2020-03-30 MED ORDER — FAMOTIDINE IN NACL 20-0.9 MG/50ML-% IV SOLN: 20.0000 mg | Freq: Once | INTRAVENOUS | Status: DC | PRN

## 2020-03-30 MED ORDER — SODIUM CHLORIDE 0.9 % IV SOLN
100.0000 mg | Freq: Once | INTRAVENOUS | Status: AC
  Administered 2020-03-30: 100 mg via INTRAVENOUS
  Filled 2020-03-30: qty 20

## 2020-03-30 MED ORDER — SODIUM CHLORIDE 0.9 % IV SOLN: INTRAVENOUS | Status: DC | PRN

## 2020-03-30 MED ORDER — METHYLPREDNISOLONE SODIUM SUCC 125 MG IJ SOLR: 125.0000 mg | Freq: Once | INTRAMUSCULAR | Status: DC | PRN

## 2020-03-30 MED ORDER — DIPHENHYDRAMINE HCL 50 MG/ML IJ SOLN: 50.0000 mg | Freq: Once | INTRAMUSCULAR | Status: DC | PRN

## 2020-03-30 MED ORDER — EPINEPHRINE 0.3 MG/0.3ML IJ SOAJ: 0.3000 mg | Freq: Once | INTRAMUSCULAR | Status: DC | PRN

## 2020-03-30 MED ORDER — ALBUTEROL SULFATE HFA 108 (90 BASE) MCG/ACT IN AERS: 2.0000 | INHALATION_SPRAY | Freq: Once | RESPIRATORY_TRACT | Status: DC | PRN

## 2020-03-30 NOTE — Progress Notes (Signed)
  Diagnosis: COVID-19  Physician: Wright, MD  Procedure: [facility] Med: remdesivir infusion - Provided patient with remdesivir fact sheet for patients, parents and caregivers prior to infusion.  Complications: No immediate complications noted.  Discharge: Discharged home   Augusto Deckman R Lerry Cordrey 03/30/2020   

## 2020-03-30 NOTE — Progress Notes (Signed)
  Diagnosis: COVID-19  Physician: Dr. Delford Field  Procedure: [facility] Med: remdesivir infusion - Provided patient with remdesivir fact sheet for patients, parents and caregivers prior to infusion.  Complications: No immediate complications noted.  Discharge: Discharged home   Eric Pierce 03/30/2020

## 2020-03-30 NOTE — Discharge Instructions (Signed)
10 Things You Can Do to Manage Your COVID-19 Symptoms at Home If you have possible or confirmed COVID-19: 1. Stay home from work and school. And stay away from other public places. If you must go out, avoid using any kind of public transportation, ridesharing, or taxis. 2. Monitor your symptoms carefully. If your symptoms get worse, call your healthcare provider immediately. 3. Get rest and stay hydrated. 4. If you have a medical appointment, call the healthcare provider ahead of time and tell them that you have or may have COVID-19. 5. For medical emergencies, call 911 and notify the dispatch personnel that you have or may have COVID-19. 6. Cover your cough and sneezes with a tissue or use the inside of your elbow. 7. Wash your hands often with soap and water for at least 20 seconds or clean your hands with an alcohol-based hand sanitizer that contains at least 60% alcohol. 8. As much as possible, stay in a specific room and away from other people in your home. Also, you should use a separate bathroom, if available. If you need to be around other people in or outside of the home, wear a mask. 9. Avoid sharing personal items with other people in your household, like dishes, towels, and bedding. 10. Clean all surfaces that are touched often, like counters, tabletops, and doorknobs. Use household cleaning sprays or wipes according to the label instructions. cdc.gov/coronavirus 01/24/2019 This information is not intended to replace advice given to you by your health care provider. Make sure you discuss any questions you have with your health care provider. Document Revised: 06/28/2019 Document Reviewed: 06/28/2019 Elsevier Patient Education  2020 Elsevier Inc.  

## 2020-03-31 ENCOUNTER — Ambulatory Visit (HOSPITAL_COMMUNITY)
Admission: RE | Admit: 2020-03-31 | Discharge: 2020-03-31 | Disposition: A | Discharge status: Home / Self Care | Payer: BC Managed Care – PPO | Source: Ambulatory Visit | Attending: Pulmonary Disease | Admitting: Pulmonary Disease

## 2020-03-31 DIAGNOSIS — J1282 Pneumonia due to coronavirus disease 2019: Secondary | ICD-10-CM | POA: Diagnosis not present

## 2020-03-31 DIAGNOSIS — U071 COVID-19: Secondary | ICD-10-CM | POA: Diagnosis present

## 2020-03-31 MED ORDER — ALBUTEROL SULFATE HFA 108 (90 BASE) MCG/ACT IN AERS: 2.0000 | INHALATION_SPRAY | Freq: Once | RESPIRATORY_TRACT | Status: DC | PRN

## 2020-03-31 MED ORDER — EPINEPHRINE 0.3 MG/0.3ML IJ SOAJ: 0.3000 mg | Freq: Once | INTRAMUSCULAR | Status: DC | PRN

## 2020-03-31 MED ORDER — METHYLPREDNISOLONE SODIUM SUCC 125 MG IJ SOLR: 125.0000 mg | Freq: Once | INTRAMUSCULAR | Status: DC | PRN

## 2020-03-31 MED ORDER — SODIUM CHLORIDE 0.9 % IV SOLN
100.0000 mg | Freq: Once | INTRAVENOUS | Status: AC
  Administered 2020-03-31: 100 mg via INTRAVENOUS
  Filled 2020-03-31: qty 20

## 2020-03-31 MED ORDER — FAMOTIDINE IN NACL 20-0.9 MG/50ML-% IV SOLN: 20.0000 mg | Freq: Once | INTRAVENOUS | Status: DC | PRN

## 2020-03-31 MED ORDER — SODIUM CHLORIDE 0.9 % IV SOLN: INTRAVENOUS | Status: DC | PRN

## 2020-03-31 MED ORDER — DIPHENHYDRAMINE HCL 50 MG/ML IJ SOLN: 50.0000 mg | Freq: Once | INTRAMUSCULAR | Status: DC | PRN

## 2020-03-31 NOTE — Discharge Instructions (Signed)
10 Things You Can Do to Manage Your COVID-19 Symptoms at Home If you have possible or confirmed COVID-19: 1. Stay home from work and school. And stay away from other public places. If you must go out, avoid using any kind of public transportation, ridesharing, or taxis. 2. Monitor your symptoms carefully. If your symptoms get worse, call your healthcare provider immediately. 3. Get rest and stay hydrated. 4. If you have a medical appointment, call the healthcare provider ahead of time and tell them that you have or may have COVID-19. 5. For medical emergencies, call 911 and notify the dispatch personnel that you have or may have COVID-19. 6. Cover your cough and sneezes with a tissue or use the inside of your elbow. 7. Wash your hands often with soap and water for at least 20 seconds or clean your hands with an alcohol-based hand sanitizer that contains at least 60% alcohol. 8. As much as possible, stay in a specific room and away from other people in your home. Also, you should use a separate bathroom, if available. If you need to be around other people in or outside of the home, wear a mask. 9. Avoid sharing personal items with other people in your household, like dishes, towels, and bedding. 10. Clean all surfaces that are touched often, like counters, tabletops, and doorknobs. Use household cleaning sprays or wipes according to the label instructions. cdc.gov/coronavirus 01/24/2019 This information is not intended to replace advice given to you by your health care provider. Make sure you discuss any questions you have with your health care provider. Document Revised: 06/28/2019 Document Reviewed: 06/28/2019 Elsevier Patient Education  2020 Elsevier Inc.  

## 2020-03-31 NOTE — Progress Notes (Signed)
°  Diagnosis: COVID-19  Physician: Dr. Shan Levans  Procedure: [facility] Med: remdesivir infusion - Provided patient with remdesivir fact sheet for patients, parents and caregivers prior to infusion.  Complications: No immediate complications noted.  Discharge: Discharged home   Eric Pierce 03/31/2020

## 2020-04-01 ENCOUNTER — Telehealth: Payer: Self-pay

## 2020-04-01 NOTE — Telephone Encounter (Signed)
Per chart review it does appear pt had infusion on 03/31/20.

## 2020-04-01 NOTE — Telephone Encounter (Signed)
Has ER follow up scheduled

## 2020-04-01 NOTE — Telephone Encounter (Signed)
[facility] Night - Client TELEPHONE ADVICE RECORD AccessNurse Patient Name: Eric Pierce Gender: Male DOB: 10-29-1960 Age: 59 Y 7 M 26 D Return Phone Number: [phone] (Primary) Address: City/State/Zip: [place] [address] Client [facility] Night - Client Client Site  [facility] [place] - Night Physician Nicki Reaper - NP Contact Type Call Who Is Calling Patient / Member / Family / Caregiver Call Type Triage / Clinical Caller Name Eric Pierce Relationship To Patient Spouse Return Phone Number [phone] (Primary) Chief Complaint Medication Question (non symptomatic) Reason for Call Symptomatic / Request for Health Information Initial Comment Callers husband has Covid and wants to know if he can skip his treatment. Translation No Nurse Assessment Nurse: Reasor, RN, Leah Date/Time (Eastern Time): 03/31/2020 8:22:37 AM Confirm and document reason for call. If symptomatic, describe symptoms. ---Caller states pt has COVID and wants to know if he can skip his last remdesivir infusion d/t difficulty getting IVs. Caller states COVID s/sx are improving, no fever, no SOB, no CP. Pt has had 4 tx already Has the patient had close contact with a person known or suspected to have the novel coronavirus illness OR traveled / lives in area with major community spread (including international travel) in the last 14 days from the onset of symptoms? * If Asymptomatic, screen for exposure and travel within the last 14 days. ---Yes Does the patient have any new or worsening symptoms? ---No Disp. Time Eric Pierce Time) Disposition Final User 03/31/2020 8:26:29 AM Paged On Call back to [facility], RN, Tacey Ruiz 03/31/2020 8:37:14 AM Paged On Call back to [facility], RN, Tacey Ruiz 03/31/2020 8:45:29 AM Clinical Call Yes Reasor, RN, [facility] Paging DoctorName Phone DateTime Result/Outcome Message Type Notes Eric Pierce - MD  [phone] 03/31/2020 8:26:29 AM Paged On Call Back to Call Center Doctor Paged Suzie Portela [phone] PLEASE NOTE: All timestamps contained within this report are represented as [place] Standard Time. CONFIDENTIALTY NOTICE: This fax transmission is intended only for the addressee. It contains information that is legally privileged, confidential or otherwise protected from use or disclosure. If you are not the intended recipient, you are strictly prohibited from reviewing, disclosing, copying using or disseminating any of this information or taking any action in reliance on or regarding this information. If you have received this fax in error, please notify us immediately by telephone so that we can arrange for its return to [place]. Phone: [phone], Toll-Free: [phone], Fax: [phone] Page: 2 of 2 Call Id: [id number] Paging DoctorName Phone DateTime Result/Outcome Message Type Notes Eric Pierce - MD [id number] 03/31/2020 8:37:14 AM Called On Call Provider - Left Message Doctor Paged Eric Pierce - MD 03/31/2020 8:45:17 AM Spoke with On Call - General Message Result OC MD recommends pt get final tx, but if pt refuses then pt needs to f/u with PCP tomorrow. VM left on pt's contact number listed

## 2020-04-03 ENCOUNTER — Other Ambulatory Visit: Payer: Self-pay

## 2020-04-03 ENCOUNTER — Telehealth (INDEPENDENT_AMBULATORY_CARE_PROVIDER_SITE_OTHER): Payer: BC Managed Care – PPO | Admitting: Primary Care

## 2020-04-03 DIAGNOSIS — N401 Enlarged prostate with lower urinary tract symptoms: Secondary | ICD-10-CM

## 2020-04-03 DIAGNOSIS — H8103 Meniere's disease, bilateral: Secondary | ICD-10-CM | POA: Diagnosis not present

## 2020-04-03 DIAGNOSIS — J1282 Pneumonia due to coronavirus disease 2019: Secondary | ICD-10-CM

## 2020-04-03 DIAGNOSIS — R3912 Poor urinary stream: Secondary | ICD-10-CM

## 2020-04-03 DIAGNOSIS — U071 COVID-19: Secondary | ICD-10-CM | POA: Diagnosis not present

## 2020-04-03 DIAGNOSIS — I10 Essential (primary) hypertension: Secondary | ICD-10-CM | POA: Diagnosis not present

## 2020-04-03 DIAGNOSIS — E871 Hypo-osmolality and hyponatremia: Secondary | ICD-10-CM

## 2020-04-03 NOTE — Assessment & Plan Note (Signed)
Secondary to Dyazide. Improved sodium since discontinuation.

## 2020-04-03 NOTE — Progress Notes (Signed)
Subjective:    Patient ID: Eric Pierce, male    DOB: 1961/01/12, 59 y.o.   MRN: [id number]  HPI  Virtual Visit via Video Note  I connected with Eric Pierce on 04/03/20 at 11:20 AM EDT by a video enabled telemedicine application and verified that I am speaking with the correct person using two identifiers.  Location: Patient: Home Provider: Office Participants: Patient and myself   I discussed the limitations of evaluation and management by telemedicine and the availability of in person appointments. The patient expressed understanding and agreed to proceed.  History of Present Illness:  Eric Pierce is a 59 year old male patient of Eric Pierce with a history of hypertension, Covid-19 infection with pneumonia, hyperlipidemia, anxiety and depression who presents today for hospital follow up.  He presented to [facility] ED via EMS on 03/25/20 with a chief complaint of Covid-19 exposure and loss of consciousness. Officially diagnosed with Covid-19 infection on 03/24/20, symptoms of worsening dyspnea, cough, nausea, vomiting, dizziness with syncope.   During his stay in the ED he was provided with an additional 2 liters of IV fluid and IV dexamethasone, remdesivir, Zofran. Labs with hyponatremia. Chest xray consistent for Covid-19 induced pneumonia. Given his progressive dyspnea he was admitted.  During his hospital stay his Dyazide was discontinued given hyponatremia. Sodium improved from 119 to 133 prior to discharge. He completed two days of remidesivir inpatient and was set up for an additional three days more in the outpatient setting. He was provided with a prescription for oral dexamethasone. Also with reports of difficulty urinating so Flomax was initiated. He was discharged home on 03/27/20.  He presented to [facility] ED via EMS on 03/28/20 for hemoptysis, chest pain, dizziness. He experienced a syncopal episode during triage and was brought back to an exam room. He endorsed compliance to  outpatient Remdesivir and oral dexamethasone as prescribed.  His spouse had noticed cogitative defects.  During his stay in the ED he underwent CT head, CT chest, both of which were negative for acute stroke and pulmonary embolism. Labs overall stable, negative consecutive troponin levels. He underwent MRI of the brain which was negative for acute process. Patient improved during his stay, was able to ambulate well. He was discharged home later that morning.   Since his discharge home he's completed his outpatient courses of remdesivir and oral dexamethasone. He's been working well to stay hydrated. He has not taken his Dyazide as instructed, this was suspected to be causing his hyponatremia. He is compliant to lisinopril, has not been checking BP at home. He's doing much better, has significantly improved over the last 3 days.    Observations/Objective:  Alert and oriented. Appears well, not sickly. No distress. Speaking in complete sentences.   Assessment and Plan:  See problem based charting.  Follow Up Instructions:  Monitor your blood pressure and update [place] if you start seeing numbers at or above 135/90 consistently.  It was a pleasure meeting you! Eric Reel, NP-C    I discussed the assessment and treatment plan with the patient. The patient was provided an opportunity to ask questions and all were answered. The patient agreed with the plan and demonstrated an understanding of the instructions.   The patient was advised to call back or seek an in-person evaluation if the symptoms worsen or if the condition fails to improve as anticipated.    Eric Nest, NP    Review of Systems  Constitutional: Negative for chills and fever.  HENT: Negative for congestion.   Respiratory: Negative for cough and shortness of breath.   Cardiovascular: Negative for chest pain.  Neurological: Negative for dizziness and headaches.       Past Medical History:  Diagnosis Date  .  Anxiety   . Meniere disease   . Post-operative nausea and vomiting      Social History   Socioeconomic History  . Marital status: Married    Spouse name: Not on file  . Number of children: 2  . Years of education: Not on file  . Highest education level: Not on file  Occupational History  . Occupation: Landscaper  Tobacco Use  . Smoking status: Never Smoker  . Smokeless tobacco: Never Used  Substance and Sexual Activity  . Alcohol use: No    Alcohol/week: 0.0 standard drinks  . Drug use: No  . Sexual activity: Not on file  Other Topics Concern  . Not on file  Social History Narrative  . Not on file   Social Determinants of Health   Financial Resource Strain:   . Difficulty of Paying Living Expenses: Not on file  Food Insecurity:   . Worried About Programme researcher, broadcasting/film/video in the Last Year: Not on file  . Ran Out of Food in the Last Year: Not on file  Transportation Needs:   . Lack of Transportation (Medical): Not on file  . Lack of Transportation (Non-Medical): Not on file  Physical Activity:   . Days of Exercise per Week: Not on file  . Minutes of Exercise per Session: Not on file  Stress:   . Feeling of Stress : Not on file  Social Connections:   . Frequency of Communication with Friends and Family: Not on file  . Frequency of Social Gatherings with Friends and Family: Not on file  . Attends Religious Services: Not on file  . Active Member of Clubs or Organizations: Not on file  . Attends Banker Meetings: Not on file  . Marital Status: Not on file  Intimate Partner Violence:   . Fear of Current or Ex-Partner: Not on file  . Emotionally Abused: Not on file  . Physically Abused: Not on file  . Sexually Abused: Not on file    Past Surgical History:  Procedure Laterality Date  . ENDOLYMPHATIC [facility] OPERATION  02/2011 and 2013   ENT - Dr. Joneen Roach  . MASTOIDECTOMY  2013  . SHOULDER SURGERY  04/2002   left    Family History  Problem Relation Age of  Onset  . Heart disease Father   . Cancer Father        prostate  . Heart disease Brother   . Colon cancer Paternal Grandmother   . Prostate cancer Paternal Grandfather     Allergies  Allergen Reactions  . Hydrocodone     Severe vomiting  . Morphine And Related     Caused nausea and vomiting    Current Outpatient Medications on File Prior to Visit  Medication Sig Dispense Refill  . albuterol (VENTOLIN HFA) 108 (90 Base) MCG/ACT inhaler Inhale 2 puffs into the lungs every 6 (six) hours. 18 g 0  . ascorbic acid (VITAMIN C) 500 MG tablet Take 1 tablet (500 mg total) by mouth daily. 30 tablet 0  . aspirin (ASPIRIN LOW DOSE) 81 MG tablet Take 81 mg by mouth daily.      . Aspirin-Calcium Carbonate 81-777 MG TABS Take by mouth.    Marland Kitchen buPROPion (WELLBUTRIN XL) 300 MG  24 hr tablet Take 1qd (Plz schedule virtual visit for future fills) 90 tablet 3  . dexamethasone (DECADRON) 6 MG tablet Take 1 tablet (6 mg total) by mouth daily. 7 tablet 7  . diazepam (VALIUM) 5 MG tablet Take 1 tablet by mouth every 8 (eight) hours as needed.     . diphenhydrAMINE (BENADRYL) 25 MG tablet Take 25 mg by mouth at bedtime.    Marland Kitchen ezetimibe-simvastatin (VYTORIN) 10-20 MG tablet TAKE 1 TABLET BY MOUTH EVERYDAY AT BEDTIME 90 tablet 0  . fexofenadine (ALLEGRA ODT) 30 MG disintegrating tablet Take by mouth.    Marland Kitchen lisinopril (ZESTRIL) 10 MG tablet TAKE 1 TABLET BY MOUTH EVERY DAY 30 tablet 0  . meclizine (ANTIVERT) 25 MG tablet Take 25 mg by mouth as needed for dizziness.    . montelukast (SINGULAIR) 10 MG tablet Take 10 mg by mouth at bedtime.     . Omega-3 Fatty Acids (FISH OIL) 1000 MG CAPS Take 2 capsules by mouth daily.     . ondansetron (ZOFRAN ODT) 4 MG disintegrating tablet Take 1 tablet (4 mg total) by mouth every 8 (eight) hours as needed for nausea or vomiting. 20 tablet 0  . tamsulosin (FLOMAX) 0.4 MG CAPS capsule Take 1 capsule (0.4 mg total) by mouth daily after supper. 30 capsule 0  . zinc sulfate 220 (50  Zn) MG capsule Take 1 capsule (220 mg total) by mouth daily. 30 capsule 0   No current facility-administered medications on file prior to visit.    There were no vitals taken for this visit.   Objective:   Physical Exam Constitutional:      Appearance: Normal appearance.  Pulmonary:     Effort: Pulmonary effort is normal.     Comments: No cough during exam Neurological:     Mental Status: He is alert and oriented to person, place, and time.  Psychiatric:        Mood and Affect: Mood normal.            Assessment & Plan:

## 2020-04-03 NOTE — Assessment & Plan Note (Signed)
No longer on Dyazide due to profound hyponatremia. Continue lisinopril for now. Repeat labs improved from 03/28/20.  I recommended he monitor BP, notify if he see's readings at or above 135/90 consistently.

## 2020-04-03 NOTE — Assessment & Plan Note (Signed)
Recent hospital admission for Covid-19 pneumonia with complications. Completed inpatient and outpatient treatment as prescribed.  Today he appears well. Encouraged him to work on hydration, increase activity as tolerated. Follow up PRN.  Hospital notes, labs, imaging reviewed.

## 2020-04-03 NOTE — Assessment & Plan Note (Signed)
Initiated on tamsulosin during hospital stay. Will defer to PCP regarding continuation.

## 2020-04-03 NOTE — Assessment & Plan Note (Signed)
Dyazide discontinued due to profound hyponatremia. Sodium improved post discontinuation.  Use Meclizine PRN.

## 2020-04-09 ENCOUNTER — Other Ambulatory Visit: Payer: Self-pay | Admitting: Family Medicine

## 2020-04-10 ENCOUNTER — Telehealth: Payer: BC Managed Care – PPO | Admitting: Internal Medicine

## 2020-04-10 ENCOUNTER — Ambulatory Visit: Payer: BC Managed Care – PPO | Admitting: Internal Medicine

## 2020-04-18 ENCOUNTER — Other Ambulatory Visit: Payer: Self-pay | Admitting: Internal Medicine

## 2020-05-17 ENCOUNTER — Other Ambulatory Visit: Payer: Self-pay | Admitting: Internal Medicine

## 2020-05-21 ENCOUNTER — Other Ambulatory Visit: Payer: Self-pay | Admitting: Internal Medicine

## 2020-05-27 ENCOUNTER — Telehealth: Payer: Self-pay | Admitting: Internal Medicine

## 2020-05-27 NOTE — Telephone Encounter (Signed)
This was 2 months ago, if he is still having problems with nausea, he needs to schedule an appointment to be evaluated.Marland KitchenMarland Kitchen

## 2020-05-27 NOTE — Telephone Encounter (Signed)
Pt wife  called in wanted to know about zofran refill he is still feeling nausea from having covid in august and needed the refill. Please advise

## 2020-05-30 ENCOUNTER — Other Ambulatory Visit: Payer: Self-pay | Admitting: Internal Medicine

## 2020-05-30 NOTE — Telephone Encounter (Signed)
Last office visit 04/03/2020 with Eric Pierce for follow up.  Last refilled 03/24/2020 for #20 with no refills.  No future appointments.

## 2020-06-17 ENCOUNTER — Other Ambulatory Visit: Payer: Self-pay | Admitting: Internal Medicine

## 2020-06-25 ENCOUNTER — Encounter: Payer: Self-pay | Admitting: Internal Medicine

## 2020-06-25 ENCOUNTER — Telehealth: Payer: Self-pay | Admitting: Internal Medicine

## 2020-06-25 NOTE — Telephone Encounter (Signed)
Should schedule a visit to discuss all symptoms he is having so I know what blood to draw.

## 2020-06-25 NOTE — Telephone Encounter (Signed)
Patient called in and stated he had covid in late august, early September. Patient stated he has been having chills and will get hot and cold recently. Pt is asking labs be done. Please advise.

## 2020-06-26 NOTE — Telephone Encounter (Signed)
Viewed schedule and patient already scheduled 12/3.

## 2020-06-27 ENCOUNTER — Ambulatory Visit (INDEPENDENT_AMBULATORY_CARE_PROVIDER_SITE_OTHER): Payer: BC Managed Care – PPO | Admitting: Internal Medicine

## 2020-06-27 ENCOUNTER — Other Ambulatory Visit: Payer: Self-pay

## 2020-06-27 ENCOUNTER — Encounter: Payer: Self-pay | Admitting: Internal Medicine

## 2020-06-27 ENCOUNTER — Ambulatory Visit (INDEPENDENT_AMBULATORY_CARE_PROVIDER_SITE_OTHER)
Admission: RE | Admit: 2020-06-27 | Discharge: 2020-06-27 | Disposition: A | Discharge status: Home / Self Care | Payer: BC Managed Care – PPO | Source: Ambulatory Visit | Attending: Internal Medicine | Admitting: Internal Medicine

## 2020-06-27 VITALS — BP 138/90 | HR 100 | Temp 97.3°F | Wt 232.0 lb

## 2020-06-27 DIAGNOSIS — R110 Nausea: Secondary | ICD-10-CM | POA: Diagnosis not present

## 2020-06-27 DIAGNOSIS — R61 Generalized hyperhidrosis: Secondary | ICD-10-CM | POA: Diagnosis not present

## 2020-06-27 DIAGNOSIS — R42 Dizziness and giddiness: Secondary | ICD-10-CM

## 2020-06-27 DIAGNOSIS — R6883 Chills (without fever): Secondary | ICD-10-CM

## 2020-06-27 IMAGING — DX DG CHEST 2V
2 series · 2 of 2 positions shown · non-contrast
Comparison: [DATE]

CLINICAL DATA: Chills.

EXAM:
CHEST - 2 VIEW

[chest pa]
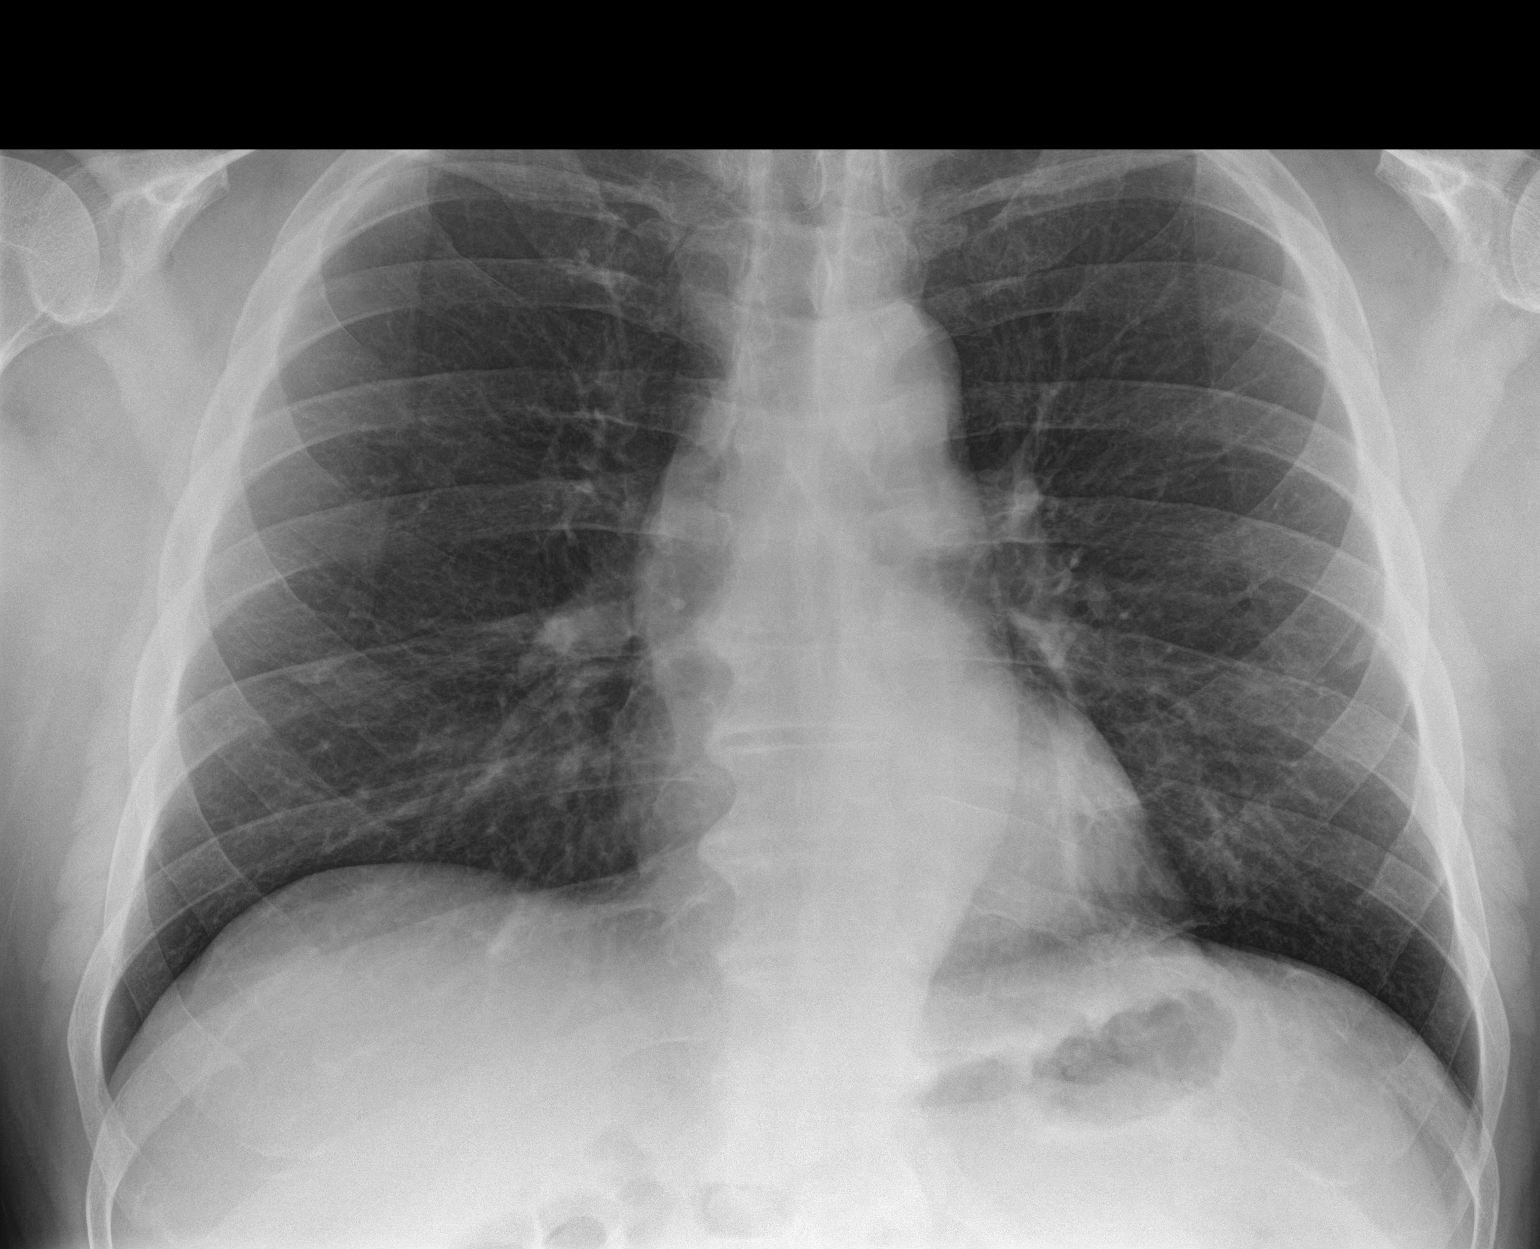

[chest lat]
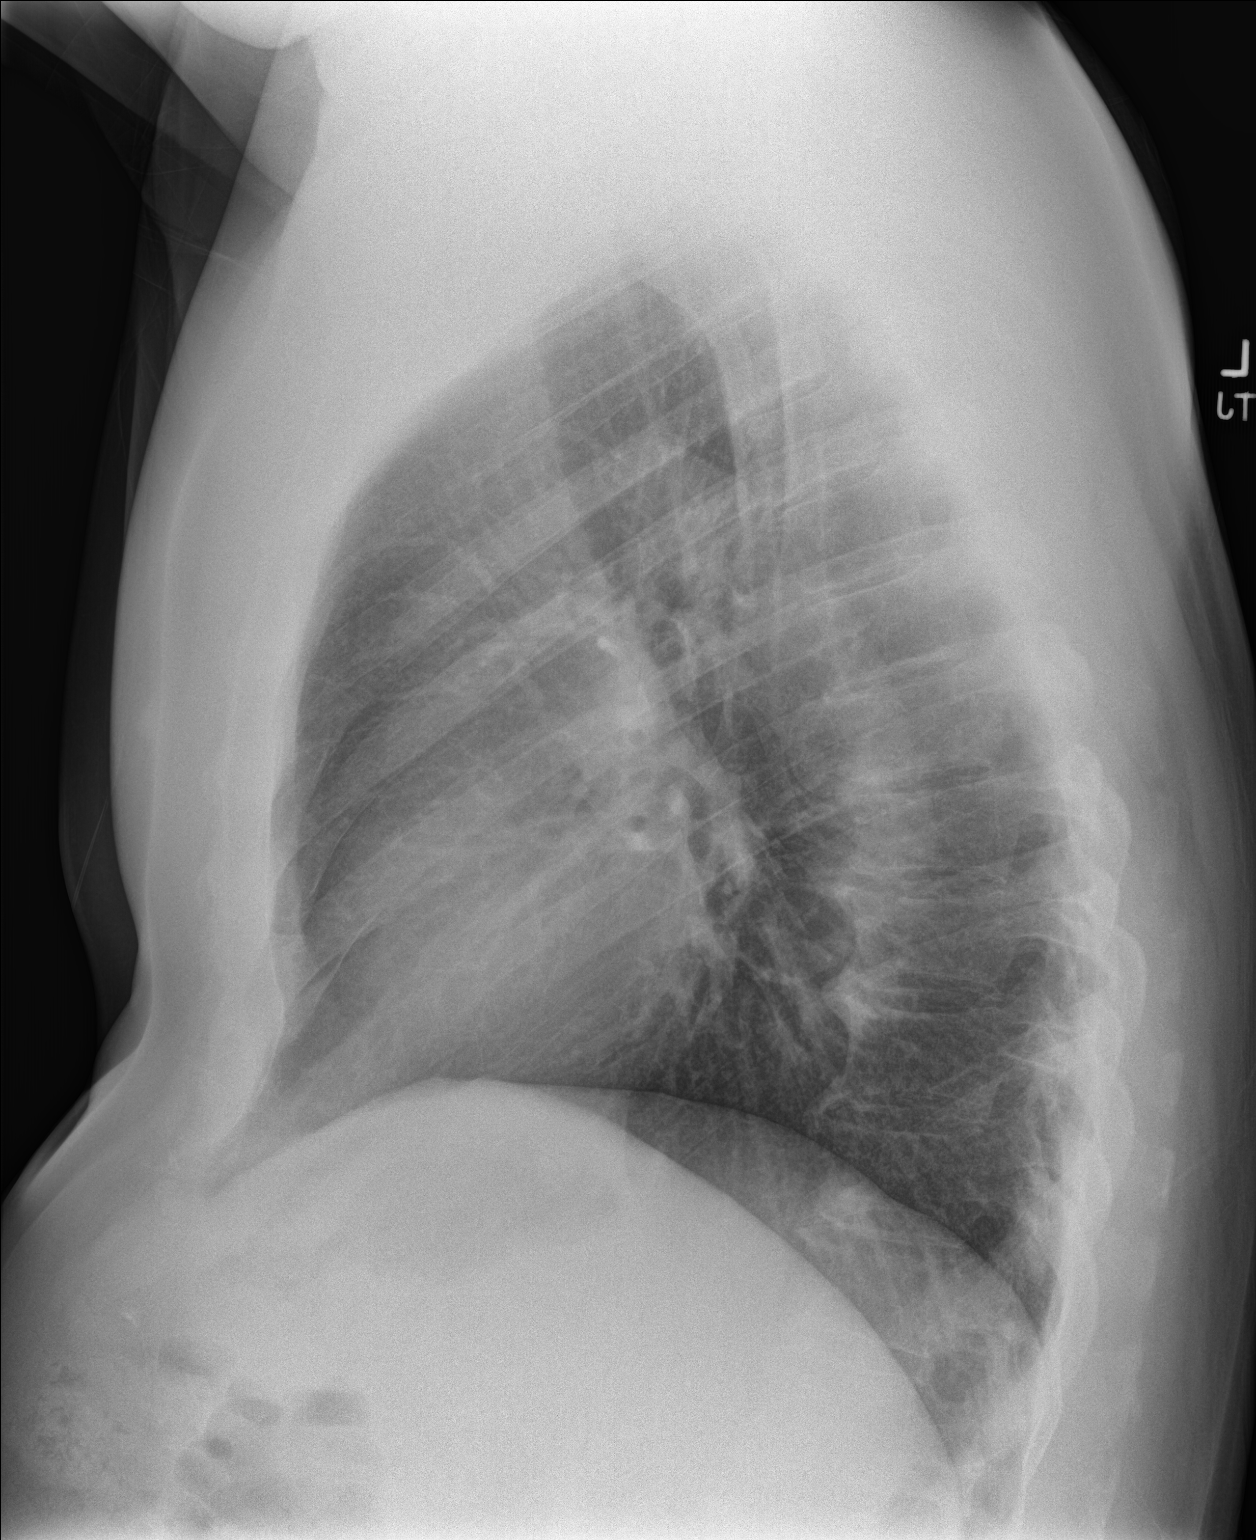

[2 of 2 positions shown; findings below may reference images not displayed]

FINDINGS: The heart size and mediastinal contours are within normal limits.
Both lungs are clear. The visualized skeletal structures are
unremarkable.
IMPRESSION: No active cardiopulmonary disease.

## 2020-06-27 NOTE — Progress Notes (Signed)
Subjective:    Patient ID: Eric Pierce, male    DOB: 07/28/1960, 59 y.o.   MRN: [id number]  HPI  Pt presents to the clinic today with c/o chills, sweats, nausea and dizziness. He reports this started after had Covid 03/2020. He reports nausea and dizziness feels similar to his Meniere's but it is happening more often than his Meniere's flares. He denies headache, visual changes, runny nose, nasal congestion, ear pain, sore throat, loss of taste, loss of smell, cough, shortness of breath, vomiting, diarrhea, blood in his stool, urinary symptoms, rash or fever.  Review of Systems      Past Medical History:  Diagnosis Date  . Anxiety   . Meniere disease   . Post-operative nausea and vomiting     Current Outpatient Medications  Medication Sig Dispense Refill  . albuterol (VENTOLIN HFA) 108 (90 Base) MCG/ACT inhaler Inhale 2 puffs into the lungs every 6 (six) hours. 18 g 0  . ascorbic acid (VITAMIN C) 500 MG tablet Take 1 tablet (500 mg total) by mouth daily. 30 tablet 0  . aspirin (ASPIRIN LOW DOSE) 81 MG tablet Take 81 mg by mouth daily.      . Aspirin-Calcium Carbonate 81-777 MG TABS Take by mouth.    Marland Kitchen buPROPion (WELLBUTRIN XL) 300 MG 24 hr tablet Take 1qd (Plz schedule virtual visit for future fills) 90 tablet 3  . dexamethasone (DECADRON) 6 MG tablet Take 1 tablet (6 mg total) by mouth daily. 7 tablet 7  . diazepam (VALIUM) 5 MG tablet Take 1 tablet by mouth every 8 (eight) hours as needed.     . diphenhydrAMINE (BENADRYL) 25 MG tablet Take 25 mg by mouth at bedtime.    Marland Kitchen ezetimibe-simvastatin (VYTORIN) 10-20 MG tablet TAKE 1 TABLET BY MOUTH EVERYDAY AT BEDTIME 90 tablet 0  . fexofenadine (ALLEGRA ODT) 30 MG disintegrating tablet Take by mouth.    Marland Kitchen lisinopril (ZESTRIL) 10 MG tablet TAKE 1 TABLET BY MOUTH EVERY DAY 30 tablet 0  . meclizine (ANTIVERT) 25 MG tablet Take 25 mg by mouth as needed for dizziness.    . montelukast (SINGULAIR) 10 MG tablet Take 10 mg by mouth at  bedtime.     . Omega-3 Fatty Acids (FISH OIL) 1000 MG CAPS Take 2 capsules by mouth daily.     . ondansetron (ZOFRAN-ODT) 4 MG disintegrating tablet TAKE 1 TABLET BY MOUTH EVERY 8 HOURS AS NEEDED FOR NAUSEA AND VOMITING 18 tablet 1  . tamsulosin (FLOMAX) 0.4 MG CAPS capsule Take 1 capsule (0.4 mg total) by mouth daily after supper. 30 capsule 0  . zinc sulfate 220 (50 Zn) MG capsule Take 1 capsule (220 mg total) by mouth daily. 30 capsule 0   No current facility-administered medications for this visit.    Allergies  Allergen Reactions  . Hydrocodone     Severe vomiting  . Morphine And Related     Caused nausea and vomiting    Family History  Problem Relation Age of Onset  . Heart disease Father   . Cancer Father        prostate  . Heart disease Brother   . Colon cancer Paternal Grandmother   . Prostate cancer Paternal Grandfather     Social History   Socioeconomic History  . Marital status: Married    Spouse name: Not on file  . Number of children: 2  . Years of education: Not on file  . Highest education level: Not on file  Occupational  History  . Occupation: Landscaper  Tobacco Use  . Smoking status: Never Smoker  . Smokeless tobacco: Never Used  Substance and Sexual Activity  . Alcohol use: No    Alcohol/week: 0.0 standard drinks  . Drug use: No  . Sexual activity: Not on file  Other Topics Concern  . Not on file  Social History Narrative  . Not on file   Social Determinants of Health   Financial Resource Strain:   . Difficulty of Paying Living Expenses: Not on file  Food Insecurity:   . Worried About Programme researcher, broadcasting/film/video in the Last Year: Not on file  . Ran Out of Food in the Last Year: Not on file  Transportation Needs:   . Lack of Transportation (Medical): Not on file  . Lack of Transportation (Non-Medical): Not on file  Physical Activity:   . Days of Exercise per Week: Not on file  . Minutes of Exercise per Session: Not on file  Stress:   .  Feeling of Stress : Not on file  Social Connections:   . Frequency of Communication with Friends and Family: Not on file  . Frequency of Social Gatherings with Friends and Family: Not on file  . Attends Religious Services: Not on file  . Active Member of Clubs or Organizations: Not on file  . Attends Banker Meetings: Not on file  . Marital Status: Not on file  Intimate Partner Violence:   . Fear of Current or Ex-Partner: Not on file  . Emotionally Abused: Not on file  . Physically Abused: Not on file  . Sexually Abused: Not on file     Constitutional: Pt reports chills, sweats. Denies fever, malaise, fatigue, headache or abrupt weight changes.  HEENT: Denies eye pain, eye redness, ear pain, ringing in the ears, wax buildup, runny nose, nasal congestion, bloody nose, or sore throat. Respiratory: Denies difficulty breathing, shortness of breath, cough or sputum production.   Cardiovascular: Denies chest pain, chest tightness, palpitations or swelling in the hands or feet.  Gastrointestinal: Pt reports intermittent nausea. Denies abdominal pain, bloating, constipation, diarrhea or blood in the stool.  GU: Denies urgency, frequency, pain with urination, burning sensation, blood in urine, odor or discharge. Musculoskeletal: Denies decrease in range of motion, difficulty with gait, muscle pain or joint pain and swelling.  Skin: Denies redness, rashes, lesions or ulcercations.  Neurological: Pt reports intermittent dizziness. Denies difficulty with memory, difficulty with speech or problems with balance and coordination.  Psych: Denies anxiety, depression, SI/HI.  No other specific complaints in a complete review of systems (except as listed in HPI above).  Objective:   Physical Exam BP 138/90   Pulse 100   Temp (!) 97.3 F (36.3 C) (Temporal)   Wt 232 lb (105.2 kg)   SpO2 97%   BMI 31.46 kg/m   Wt Readings from Last 3 Encounters:  03/28/20 235 lb (106.6 kg)  03/25/20  235 lb (106.6 kg)  03/06/20 239 lb (108.4 kg)    General: Appears his stated age, obese, in NAD. Skin: Warm, dry and intact. No rashes noted. HEENT: Head: normal shape and size; Eyes: sclera white, no icterus, conjunctiva pink, PERRLA and EOMs intact;  Neck:  Neck supple, trachea midline. No masses, lumps or thyromegaly present.  Cardiovascular: Normal rate and rhythm. S1,S2 noted.  No murmur, rubs or gallops noted. Pulmonary/Chest: Normal effort and positive vesicular breath sounds. No respiratory distress. No wheezes, rales or ronchi noted.  Abdomen: Soft and nontender.  Normal bowel sounds. No distention or masses noted.  Musculoskeletal:  No difficulty with gait.  Neurological: Alert and oriented.  Coordination normal.  Psychiatric: Mildly anxious appearing.  BMET    Component Value Date/Time   NA 132 (L) 03/28/2020 0504   K 3.8 03/28/2020 0504   CL 96 (L) 03/28/2020 0504   CO2 28 03/28/2020 0504   GLUCOSE 148 (H) 03/28/2020 0504   BUN 17 03/28/2020 0504   CREATININE 1.13 03/28/2020 0504   CALCIUM 8.8 (L) 03/28/2020 0504   GFRNONAA >60 03/28/2020 0504   GFRAA >60 03/28/2020 0504    Lipid Panel     Component Value Date/Time   CHOL 170 03/06/2020 0912   TRIG 83.0 03/06/2020 0912   HDL 42.50 03/06/2020 0912   CHOLHDL 4 03/06/2020 0912   VLDL 16.6 03/06/2020 0912   LDLCALC 111 (H) 03/06/2020 0912    CBC    Component Value Date/Time   WBC 13.1 (H) 03/28/2020 0504   RBC 5.06 03/28/2020 0504   HGB 15.7 03/28/2020 0504   HCT 42.9 03/28/2020 0504   PLT 212 03/28/2020 0504   MCV 84.8 03/28/2020 0504   MCH 31.0 03/28/2020 0504   MCHC 36.6 (H) 03/28/2020 0504   RDW 11.6 03/28/2020 0504   LYMPHSABS 2.4 03/28/2020 0504   MONOABS 1.4 (H) 03/28/2020 0504   EOSABS 0.0 03/28/2020 0504   BASOSABS 0.0 03/28/2020 0504    Hgb A1C Lab Results  Component Value Date   HGBA1C 5.6 03/06/2020            Assessment & Plan:  Chills, Sweats, Nausea,  Dizziness:  Intermittent symptoms since having Covid- discussed these could be long haul symptoms Will check CBC, CMET, TSH today Will obtain chest xray- follow up covid pneumonia He will consider FMLA through the end of the year  Will follow up after labs and imaging, return precautions discussed  Nicki Reaper, NP This visit occurred during the SARS-CoV-2 public health emergency.  Safety protocols were in place, including screening questions prior to the visit, additional usage of staff PPE, and extensive cleaning of exam room while observing appropriate contact time as indicated for disinfecting solutions.

## 2020-06-28 LAB — CBC
HCT: 48.4 % (ref 38.5–50.0)
Hemoglobin: 16.9 g/dL (ref 13.2–17.1)
MCH: 31.2 pg (ref 27.0–33.0)
MCHC: 34.9 g/dL (ref 32.0–36.0)
MCV: 89.3 fL (ref 80.0–100.0)
MPV: 10.3 fL (ref 7.5–12.5)
Platelets: 317 10*3/uL (ref 140–400)
RBC: 5.42 10*6/uL (ref 4.20–5.80)
RDW: 11.8 % (ref 11.0–15.0)
WBC: 9.2 10*3/uL (ref 3.8–10.8)

## 2020-06-28 LAB — COMPREHENSIVE METABOLIC PANEL
AG Ratio: 1.7 (calc) (ref 1.0–2.5)
ALT: 35 U/L (ref 9–46)
AST: 21 U/L (ref 10–35)
Albumin: 4.5 g/dL (ref 3.6–5.1)
Alkaline phosphatase (APISO): 88 U/L (ref 35–144)
BUN: 10 mg/dL (ref 7–25)
CO2: 26 mmol/L (ref 20–32)
Calcium: 10.1 mg/dL (ref 8.6–10.3)
Chloride: 100 mmol/L (ref 98–110)
Creat: 0.98 mg/dL (ref 0.70–1.33)
Globulin: 2.7 g/dL (calc) (ref 1.9–3.7)
Glucose, Bld: 104 mg/dL — ABNORMAL HIGH (ref 65–99)
Potassium: 4.1 mmol/L (ref 3.5–5.3)
Sodium: 139 mmol/L (ref 135–146)
Total Bilirubin: 0.5 mg/dL (ref 0.2–1.2)
Total Protein: 7.2 g/dL (ref 6.1–8.1)

## 2020-06-28 LAB — TSH: TSH: 3.09 mIU/L (ref 0.40–4.50)

## 2020-06-28 NOTE — Patient Instructions (Signed)

## 2020-07-02 ENCOUNTER — Telehealth: Payer: Self-pay | Admitting: Internal Medicine

## 2020-07-02 NOTE — Telephone Encounter (Signed)
Done, given back to Alice 

## 2020-07-02 NOTE — Telephone Encounter (Signed)
Called patient and told paperwork done. Expressed understanding and will pick it up 12/9.   Copy for scan Copy for patient

## 2020-07-02 NOTE — Telephone Encounter (Signed)
Patient came into office and dropped off FMLA forms. Filled and placed in inbox for review and signature.

## 2020-07-12 ENCOUNTER — Other Ambulatory Visit: Payer: Self-pay | Admitting: Internal Medicine

## 2020-08-04 ENCOUNTER — Other Ambulatory Visit: Payer: Self-pay | Admitting: Internal Medicine

## 2020-09-05 ENCOUNTER — Other Ambulatory Visit: Payer: Self-pay

## 2020-09-05 MED ORDER — BUPROPION HCL ER (XL) 300 MG PO TB24
300.0000 mg | ORAL_TABLET | Freq: Every day | ORAL | 0 refills | Status: DC
Start: 2020-09-05 — End: 2020-12-11

## 2020-09-12 ENCOUNTER — Other Ambulatory Visit: Payer: Self-pay | Admitting: Internal Medicine

## 2020-09-26 ENCOUNTER — Other Ambulatory Visit: Payer: Self-pay | Admitting: Internal Medicine

## 2020-12-04 ENCOUNTER — Other Ambulatory Visit: Payer: Self-pay | Admitting: Internal Medicine

## 2020-12-11 ENCOUNTER — Telehealth: Payer: Self-pay

## 2020-12-11 ENCOUNTER — Other Ambulatory Visit: Payer: Self-pay | Admitting: Internal Medicine

## 2020-12-11 MED ORDER — BUPROPION HCL ER (XL) 300 MG PO TB24: 1.0000 | ORAL_TABLET | Freq: Every day | ORAL | 0 refills | Status: AC

## 2020-12-11 NOTE — Addendum Note (Signed)
Addended by: Worthy Rancher B on: 12/11/2020 01:52 PM   Modules accepted: Orders

## 2020-12-11 NOTE — Telephone Encounter (Signed)
Moving to [place] 

## 2021-01-06 ENCOUNTER — Other Ambulatory Visit: Payer: Self-pay | Admitting: Internal Medicine

## 2021-01-06 NOTE — Telephone Encounter (Signed)
  Notes to clinic Not a practice we approve rx for.  

## 2021-01-07 NOTE — Telephone Encounter (Signed)
Noted that patient is following Nicki Reaper NP to her new office.

## 2021-01-10 ENCOUNTER — Other Ambulatory Visit: Payer: Self-pay | Admitting: Internal Medicine

## 2021-01-10 NOTE — Telephone Encounter (Signed)
Last RF 01/07/21 #30

## 2023-07-24 ENCOUNTER — Ambulatory Visit
Admission: EM | Admit: 2023-07-24 | Discharge: 2023-07-24 | Disposition: A | Discharge status: Home / Self Care | Payer: BC Managed Care – PPO

## 2023-07-24 DIAGNOSIS — J069 Acute upper respiratory infection, unspecified: Secondary | ICD-10-CM | POA: Diagnosis not present

## 2023-07-24 LAB — POC COVID19/FLU A&B COMBO
Covid Antigen, POC: NEGATIVE
Influenza A Antigen, POC: NEGATIVE
Influenza B Antigen, POC: NEGATIVE

## 2023-07-24 MED ORDER — AZITHROMYCIN 250 MG PO TABS: 250.0000 mg | ORAL_TABLET | Freq: Every day | ORAL | 0 refills | Status: AC

## 2023-07-24 MED ORDER — PREDNISONE 10 MG (21) PO TBPK: ORAL_TABLET | Freq: Every day | ORAL | 0 refills | Status: AC

## 2023-07-24 NOTE — ED Provider Notes (Signed)
Eric Pierce    CSN: [id number] Arrival date & time: 07/24/23  4098      History   Chief Complaint Chief Complaint  Patient presents with   URI    HPI Eric Pierce is a 62 y.o. male.  Patient presents with 3-day history of runny aches, ear pain, sore throat, congestion, cough.  He denies fever, shortness of breath, chest pain, vomiting, diarrhea.  No OTC medication taken today.  His medical history includes hypertension, hyperlipidemia, anxiety, Mnire's disease.  The history is provided by the patient and medical records.    Past Medical History:  Diagnosis Date   Anxiety    Meniere disease    Post-operative nausea and vomiting     Patient Active Problem List   Diagnosis Date Noted   Benign prostatic hyperplasia with weak urinary stream    Anxiety and depression    HTN (hypertension) 02/21/2020   Meniere disease 04/19/2011   Hyperlipidemia 08/17/2007   Anxiety state 11/01/2006    Past Surgical History:  Procedure Laterality Date   ENDOLYMPHATIC [facility] OPERATION  02/2011 and 2013   ENT - Dr. Joneen Roach   MASTOIDECTOMY  2013   SHOULDER SURGERY  04/2002   left       Home Medications    Prior to Admission medications   Medication Sig Start Date End Date Taking? Authorizing Provider  ARMOUR THYROID 60 MG tablet Take by mouth. Patient not taking: Reported on 07/24/2023 06/29/21   [provider]  azithromycin (ZITHROMAX) 250 MG tablet Take 1 tablet (250 mg total) by mouth daily. Take first 2 tablets together, then 1 every day until finished. 07/24/23  Yes Mickie Bail, NP  predniSONE (STERAPRED UNI-PAK 21 TAB) 10 MG (21) TBPK tablet Take by mouth daily. As directed 07/24/23  Yes Mickie Bail, NP  tamsulosin (FLOMAX) 0.4 MG CAPS capsule Take by mouth. 08/07/21  Yes [provider]  triamterene-hydrochlorothiazide (DYAZIDE) 37.5-25 MG capsule Take 1 tablet by mouth daily. Patient not taking: Reported on 07/24/2023 08/23/22   [provider]  ascorbic acid (VITAMIN C) 500 MG tablet Take 1 tablet (500 mg total) by mouth daily. Patient taking differently: Take 1,000 mg by mouth 2 (two) times daily.  03/28/20   Alford Highland, MD  aspirin (ASPIRIN LOW DOSE) 81 MG tablet Take 81 mg by mouth daily.   Patient not taking: Reported on 07/24/2023    [provider]  buPROPion (WELLBUTRIN XL) 300 MG 24 hr tablet Take 1 tablet (300 mg total) by mouth daily. Patient not taking: Reported on 07/24/2023 12/11/20   Worthy Rancher B, FNP  diazepam (VALIUM) 5 MG tablet Take 1 tablet by mouth every 8 (eight) hours as needed.  Patient not taking: Reported on 07/24/2023 10/15/13   [provider]  diphenhydrAMINE (BENADRYL) 25 MG tablet Take 25 mg by mouth at bedtime. Patient not taking: Reported on 07/24/2023    [provider]  lisinopril (ZESTRIL) 10 MG tablet Take 1 tablet (10 mg total) by mouth daily. Patient not taking: Reported on 07/24/2023 01/07/21   Lorre Munroe, NP  meclizine (ANTIVERT) 25 MG tablet Take 25 mg by mouth as needed for dizziness. Patient not taking: Reported on 07/24/2023    [provider]  montelukast (SINGULAIR) 10 MG tablet Take 10 mg by mouth at bedtime.  Patient not taking: Reported on 07/24/2023 05/22/19   [provider]  Omega-3 Fatty Acids (FISH OIL) 1000 MG CAPS Take 2 capsules by mouth  daily.  Patient not taking: Reported on 07/24/2023    [provider]  ondansetron (ZOFRAN-ODT) 4 MG disintegrating tablet TAKE 1 TABLET BY MOUTH EVERY 8 HOURS AS NEEDED FOR NAUSEA AND VOMITING Patient not taking: Reported on 07/24/2023 05/30/20   Lorre Munroe, NP  zinc sulfate 220 (50 Zn) MG capsule Take 1 capsule (220 mg total) by mouth daily. Patient not taking: Reported on 07/24/2023 03/28/20   Alford Highland, MD    Family History Family History  Problem Relation Age of Onset   Heart disease Father    Cancer Father        prostate   Heart disease  Brother    Colon cancer Paternal Grandmother    Prostate cancer Paternal Grandfather     Social History Social History   Tobacco Use   Smoking status: Never   Smokeless tobacco: Never  Substance Use Topics   Alcohol use: No    Alcohol/week: 0.0 standard drinks of alcohol   Drug use: No     Allergies   Hydrocodone and Morphine and codeine   Review of Systems Review of Systems  Constitutional:  Negative for chills and fever.  HENT:  Positive for congestion, ear pain and sore throat.   Respiratory:  Positive for cough. Negative for shortness of breath.   Cardiovascular:  Negative for chest pain and palpitations.  Gastrointestinal:  Negative for diarrhea and vomiting.     Physical Exam Triage Vital Signs ED Triage Vitals  Encounter Vitals Group     BP 07/24/23 1009 137/82     Systolic BP Percentile --      Diastolic BP Percentile --      Pulse Rate 07/24/23 1000 93     Resp 07/24/23 1000 18     Temp 07/24/23 1000 98.8 F (37.1 C)     Temp src --      SpO2 07/24/23 1000 97 %     Weight --      Height --      Head Circumference --      Peak Flow --      Pain Score 07/24/23 1003 3     Pain Loc --      Pain Education --      Exclude from Growth Chart --    No data found.  Updated Vital Signs BP 137/82   Pulse 93   Temp 98.8 F (37.1 C)   Resp 18   SpO2 97%   Visual Acuity Right Eye Distance:   Left Eye Distance:   Bilateral Distance:    Right Eye Near:   Left Eye Near:    Bilateral Near:     Physical Exam Constitutional:      General: He is not in acute distress. HENT:     Right Ear: Tympanic membrane normal.     Left Ear: Tympanic membrane normal.     Nose: Congestion and rhinorrhea present.     Mouth/Throat:     Mouth: Mucous membranes are moist.     Pharynx: Oropharynx is clear.  Cardiovascular:     Rate and Rhythm: Normal rate and regular rhythm.     Heart sounds: Normal heart sounds.  Pulmonary:     Effort: Pulmonary effort is  normal. No respiratory distress.     Breath sounds: Normal breath sounds.  Skin:    General: Skin is warm and dry.  Neurological:     Mental Status: He is alert.      UC  Treatments / Results  Labs (all labs ordered are listed, but only abnormal results are displayed) Labs Reviewed  POC COVID19/FLU A&B COMBO    EKG   Radiology No results found.  Procedures Procedures (including critical care time)  Medications Ordered in UC Medications - No data to display  Initial Impression / Assessment and Plan / UC Course  I have reviewed the triage vital signs and the nursing notes.  Pertinent labs & imaging results that were available during my care of the patient were reviewed by me and considered in my medical decision making (see chart for details).    Viral URI with cough.  Afebrile and vital signs are stable.  Lungs are clear and O2 sat is 97% on room air.  Rapid flu and COVID-negative.  Discussed symptomatic treatment including plain Mucinex, Tylenol or ibuprofen.  Patient reports he has similar symptoms yearly and requires a Z-Pak and prednisone.  I discussed with the patient that his symptoms are viral and do not appear to require an antibiotic at this time.  Discussed that, if his symptoms are not improving with symptomatic treatment in 2 to 3 days, he can start prednisone and Zithromax which I have sent to his pharmacy for him.  Instructed him to follow-up with his primary care provider.  Education provided on URI.  Patient agrees to plan of care.  Final Clinical Impressions(s) / UC Diagnoses   Final diagnoses:  Viral URI with cough     Discharge Instructions      The COVID and flu tests are negative.   Take Tylenol or ibuprofen as needed for fever or discomfort.  Take plain Mucinex as needed for congestion.  Rest and keep yourself hydrated.    If your symptoms are not improving in 2 to 3 days, start the Zithromax and prednisone as directed.    Follow-up with your  primary care provider.         ED Prescriptions     Medication Sig Dispense Auth. Provider   predniSONE (STERAPRED UNI-PAK 21 TAB) 10 MG (21) TBPK tablet Take by mouth daily. As directed 21 tablet Mickie Bail, NP   azithromycin (ZITHROMAX) 250 MG tablet Take 1 tablet (250 mg total) by mouth daily. Take first 2 tablets together, then 1 every day until finished. 6 tablet Mickie Bail, NP      PDMP not reviewed this encounter.   Mickie Bail, NP 07/24/23 1029

## 2023-07-24 NOTE — Discharge Instructions (Addendum)
The COVID and flu tests are negative.   Take Tylenol or ibuprofen as needed for fever or discomfort.  Take plain Mucinex as needed for congestion.  Rest and keep yourself hydrated.    If your symptoms are not improving in 2 to 3 days, start the Zithromax and prednisone as directed.    Follow-up with your primary care provider.

## 2023-07-24 NOTE — ED Triage Notes (Signed)
Patient to Urgent Care with complaints of sore throat/ nasal congestion/ body aches/ headaches/ bilateral ear pain.  Symptoms started three days ago. Poor sleep. Having to sleep sitting up.  Meds: advil/ tylenol.
